# Patient Record
Sex: Female | Born: 1990 | Race: Black or African American | Hispanic: No | Marital: Single | Smoking: Former smoker
Health system: Southern US, Community
[De-identification: ages and names within clinical notes are randomized; demographics above are authoritative.]

## PROBLEM LIST (undated history)

## (undated) HISTORY — PX: WISDOM TOOTH EXTRACTION: SHX21

---

## 2012-12-06 ENCOUNTER — Ambulatory Visit (INDEPENDENT_AMBULATORY_CARE_PROVIDER_SITE_OTHER): Payer: BC Managed Care – PPO | Admitting: Internal Medicine

## 2012-12-06 VITALS — BP 108/74 | HR 74 | Temp 98.0°F | Resp 16 | Ht 68.0 in | Wt 115.6 lb

## 2012-12-06 DIAGNOSIS — J019 Acute sinusitis, unspecified: Secondary | ICD-10-CM

## 2012-12-06 MED ORDER — AMOXICILLIN 875 MG PO TABS: 875.0000 mg | ORAL_TABLET | Freq: Two times a day (BID) | ORAL | Status: DC

## 2012-12-06 MED ORDER — HYDROCODONE-HOMATROPINE 5-1.5 MG/5ML PO SYRP: 5.0000 mL | ORAL_SOLUTION | Freq: Four times a day (QID) | ORAL | Status: DC | PRN

## 2012-12-06 NOTE — Progress Notes (Signed)
  Subjective:    Patient ID: Amy Cohen, female    DOB: 1990/11/05, 22 y.o.   MRN: [id number]  HPI started with a flulike illness several days ago. Over the past 3 days has been unable to sleep due to cough. Cough is only productive in the morning. Also has an early morning sore throat. Has significant sinus pressure. No fever.  Now teaching school    Review of Systems     Objective:   Physical Exam TMs clear Nares boggy with purulence Tender left maxillary area Throat clear No nodes or thyromegaly Chest clear       Assessment & Plan:  Acute sinusitis, unspecified  Meds ordered this encounter  Medications  . HYDROcodone-homatropine (HYCODAN) 5-1.5 MG/5ML syrup    Sig: Take 5 mLs by mouth every 6 (six) hours as needed for cough.    Dispense:  120 mL    Refill:  0  . amoxicillin (AMOXIL) 875 MG tablet    Sig: Take 1 tablet (875 mg total) by mouth 2 (two) times daily.    Dispense:  20 tablet    Refill:  0   Check if not better in one week

## 2014-02-12 ENCOUNTER — Ambulatory Visit (INDEPENDENT_AMBULATORY_CARE_PROVIDER_SITE_OTHER): Payer: BC Managed Care – PPO | Admitting: Emergency Medicine

## 2014-02-12 VITALS — BP 110/68 | HR 83 | Temp 98.5°F | Resp 16 | Ht 67.0 in | Wt 114.2 lb

## 2014-02-12 DIAGNOSIS — J209 Acute bronchitis, unspecified: Secondary | ICD-10-CM

## 2014-02-12 MED ORDER — PROMETHAZINE-CODEINE 6.25-10 MG/5ML PO SYRP: 5.0000 mL | ORAL_SOLUTION | Freq: Four times a day (QID) | ORAL | Status: DC | PRN

## 2014-02-12 MED ORDER — AZITHROMYCIN 250 MG PO TABS: ORAL_TABLET | ORAL | Status: DC

## 2014-02-12 NOTE — Progress Notes (Signed)
[facility] [address], [place] [id number] [phone]  Date:  02/12/2014   Name:  Lauraine RinneLeigh Wantz   DOB:  07/31/1991   MRN:  [id number]  PCP:  No primary provider on file.    Chief Complaint: Cough, Sore Throat and Chest Pain   History of Present Illness:  Lauraine RinneLeigh Ellwood is a 23 y.o. very pleasant female patient who presents with the following:  Ill with a cough for the past week.  Now has anterior pleuritic chest wall pain. No wheezing or shortness of breath.  No fever or chills.. Has scant nasal mucosal drainage.  Cough productive of mucopurulent sputum.  No improvement with over the counter medications or other home remedies. Denies other complaint or health concern today.   There are no active problems to display for this patient.   No past medical history on file.  No past surgical history on file.  History  Substance Use Topics  . Smoking status: Current Every Day Smoker -- 6 years    Types: Cigarettes  . Smokeless tobacco: Not on file  . Alcohol Use: Yes    No family history on file.  No Known Allergies  Medication list has been reviewed and updated.  No current outpatient prescriptions on file prior to visit.   No current facility-administered medications on file prior to visit.    Review of Systems:  As per HPI, otherwise negative.    Physical Examination: Filed Vitals:   02/12/14 1459  BP: 110/68  Pulse: 83  Temp: 98.5 F (36.9 C)  Resp: 16   Filed Vitals:   02/12/14 1459  Height: 5\' 7"  (1.702 m)  Weight: 114 lb 3.2 oz (51.801 kg)   Body mass index is 17.88 kg/(m^2). Ideal Body Weight: Weight in (lb) to have BMI = 25: 159.3  GEN: WDWN, NAD, Non-toxic, A & O x 3 HEENT: Atraumatic, Normocephalic. Neck supple. No masses, No LAD. Ears and Nose: No external deformity. CV: RRR, No M/G/R. No JVD. No thrill. No extra heart sounds. PULM: CTA B, no wheezes, crackles, rhonchi. No retractions. No resp. distress. No accessory  muscle use. ABD: S, NT, ND, +BS. No rebound. No HSM. EXTR: No c/c/e NEURO Normal gait.  PSYCH: Normally interactive. Conversant. Not depressed or anxious appearing.  Calm demeanor.    Assessment and Plan: Bronchitis zpak Phen c cod  Signed,  Phillips OdorJeffery Anderson, MD

## 2014-02-12 NOTE — Patient Instructions (Signed)

## 2014-03-18 ENCOUNTER — Ambulatory Visit (INDEPENDENT_AMBULATORY_CARE_PROVIDER_SITE_OTHER): Payer: BC Managed Care – PPO | Admitting: Family Medicine

## 2014-03-18 VITALS — BP 110/60 | HR 81 | Temp 97.6°F | Resp 16 | Ht 67.5 in | Wt 116.4 lb

## 2014-03-18 DIAGNOSIS — N898 Other specified noninflammatory disorders of vagina: Secondary | ICD-10-CM

## 2014-03-18 DIAGNOSIS — N920 Excessive and frequent menstruation with regular cycle: Secondary | ICD-10-CM

## 2014-03-18 DIAGNOSIS — N921 Excessive and frequent menstruation with irregular cycle: Secondary | ICD-10-CM

## 2014-03-18 LAB — POCT WET PREP WITH KOH
KOH Prep POC: NEGATIVE
Trichomonas, UA: NEGATIVE
WBC Wet Prep HPF POC: NEGATIVE
Yeast Wet Prep HPF POC: NEGATIVE

## 2014-03-18 MED ORDER — AZITHROMYCIN 250 MG PO TABS: ORAL_TABLET | ORAL | Status: DC

## 2014-03-18 NOTE — Progress Notes (Signed)
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 She is sexually active with one partner. She takes Depo-Provera for contraception in her next due the end of this month.  The last 3 weeks she's been having some intermittent spotting without cramps.  Objective: No acute distress External Genitalia: Normal Abdomen: Soft nontender without masses Bimanual exam normal Vaginal exam: Small amount of blood in the cervical os with no cervical motion tenderness, scant blood in the vagina.  Results for orders placed in visit on 03/18/14  POCT WET PREP WITH KOH      Result Value Ref Range   Trichomonas, UA Negative     Clue Cells Wet Prep HPF POC 0-5     Epithelial Wet Prep HPF POC 10-15     Yeast Wet Prep HPF POC neg     Bacteria Wet Prep HPF POC trace     RBC Wet Prep HPF POC 0-1     WBC Wet Prep HPF POC neg     KOH Prep POC Negative     Assessment: No obvious cause for the bleeding. She may have a Chlamydia infection such as alcohol he has been treated with while we await the GEN probe results.  Vaginal discharge - Plan: POCT Wet Prep with KOH, GC/Chlamydia Probe Amp, HIV antibody, azithromycin (ZITHROMAX) 250 MG tablet  Menometrorrhagia - Plan: azithromycin (ZITHROMAX) 250 MG tablet  Signed, Elvina Sidle, MD

## 2014-03-19 LAB — HIV ANTIBODY (ROUTINE TESTING W REFLEX): HIV 1&2 Ab, 4th Generation: NONREACTIVE

## 2014-03-20 LAB — GC/CHLAMYDIA PROBE AMP
CT Probe RNA: NEGATIVE
GC Probe RNA: NEGATIVE

## 2015-07-25 ENCOUNTER — Encounter: Payer: Self-pay | Admitting: General Practice

## 2015-07-25 ENCOUNTER — Ambulatory Visit (INDEPENDENT_AMBULATORY_CARE_PROVIDER_SITE_OTHER): Payer: Self-pay | Admitting: General Practice

## 2015-07-25 DIAGNOSIS — Z3201 Encounter for pregnancy test, result positive: Secondary | ICD-10-CM

## 2015-07-25 DIAGNOSIS — O0932 Supervision of pregnancy with insufficient antenatal care, second trimester: Secondary | ICD-10-CM

## 2015-07-25 LAB — POCT PREGNANCY, URINE: PREG TEST UR: POSITIVE — AB

## 2015-07-25 NOTE — Progress Notes (Signed)
Patient here for pregnancy test today, test positive. Patient reports first positive upt in May. LMP 01/27/15. EDD 11/03/15. 8028w4d today. Would like to start care here. Will schedule u/s today and delay blood work till new OB visit. New OB packets given

## 2015-07-29 ENCOUNTER — Ambulatory Visit: Payer: Self-pay

## 2015-07-31 ENCOUNTER — Ambulatory Visit (INDEPENDENT_AMBULATORY_CARE_PROVIDER_SITE_OTHER): Payer: BC Managed Care – PPO | Admitting: Advanced Practice Midwife

## 2015-07-31 ENCOUNTER — Encounter: Payer: Self-pay | Admitting: Advanced Practice Midwife

## 2015-07-31 VITALS — BP 103/61 | HR 79 | Temp 98.0°F | Ht 69.0 in | Wt 126.7 lb

## 2015-07-31 DIAGNOSIS — Z124 Encounter for screening for malignant neoplasm of cervix: Secondary | ICD-10-CM | POA: Diagnosis not present

## 2015-07-31 DIAGNOSIS — Z113 Encounter for screening for infections with a predominantly sexual mode of transmission: Secondary | ICD-10-CM | POA: Diagnosis not present

## 2015-07-31 DIAGNOSIS — O99312 Alcohol use complicating pregnancy, second trimester: Secondary | ICD-10-CM

## 2015-07-31 DIAGNOSIS — Z3400 Encounter for supervision of normal first pregnancy, unspecified trimester: Secondary | ICD-10-CM | POA: Insufficient documentation

## 2015-07-31 DIAGNOSIS — N898 Other specified noninflammatory disorders of vagina: Secondary | ICD-10-CM

## 2015-07-31 DIAGNOSIS — O26892 Other specified pregnancy related conditions, second trimester: Secondary | ICD-10-CM

## 2015-07-31 DIAGNOSIS — Z3402 Encounter for supervision of normal first pregnancy, second trimester: Secondary | ICD-10-CM

## 2015-07-31 DIAGNOSIS — O99311 Alcohol use complicating pregnancy, first trimester: Secondary | ICD-10-CM | POA: Insufficient documentation

## 2015-07-31 LAB — POCT URINALYSIS DIP (DEVICE)
BILIRUBIN URINE: NEGATIVE
Glucose, UA: NEGATIVE mg/dL
HGB URINE DIPSTICK: NEGATIVE
Ketones, ur: NEGATIVE mg/dL
NITRITE: NEGATIVE
PH: 6.5 (ref 5.0–8.0)
Protein, ur: NEGATIVE mg/dL
Specific Gravity, Urine: 1.02 (ref 1.005–1.030)
Urobilinogen, UA: 0.2 mg/dL (ref 0.0–1.0)

## 2015-07-31 NOTE — Addendum Note (Signed)
Addended by: Gerome ApleyZEYFANG, Emorie Mcfate L on: 07/31/2015 12:03 PM   Modules accepted: Orders

## 2015-07-31 NOTE — Progress Notes (Signed)
Needs pap and labs.  Requests STI testing today.   New OB packet given Advance Directive information given Breastfeeding Tip of the Week reviewed.

## 2015-07-31 NOTE — Progress Notes (Signed)
   Subjective:    Amy Cohen is a G1P0000 4075w3d being seen today for her first obstetrical visit.  Her obstetrical history is significant for none. Patient does intend to breast feed. Pregnancy history fully reviewed.  Patient reports no complaints.  Filed Vitals:   07/31/15 0811 07/31/15 0824  BP: 103/61   Pulse: 79   Temp: 98 F (36.7 C)   Height:  5\' 9"  (1.753 m)  Weight: 126 lb 11.2 oz (57.471 kg)     HISTORY: OB History  Gravida Para Term Preterm AB SAB TAB Ectopic Multiple Living  1 0 0 0 0 0 0 0 0 0     # Outcome Date GA Lbr Len/2nd Weight Sex Delivery Anes PTL Lv  1 Current              History reviewed. No pertinent past medical history. Past Surgical History  Procedure Laterality Date  . Wisdom tooth extraction     Family History  Problem Relation Age of Onset  . Autism Brother   . Cancer Maternal Aunt   . Heart disease Maternal Grandfather   . Hypertension Paternal Grandmother      Exam    Uterus:     Pelvic Exam:    Perineum: No Hemorrhoids   Vulva: normal   Vagina:  normal mucosa, thick yellow discharge with odor   pH:    Cervix: no bleeding following Pap, no cervical motion tenderness, no lesions and nulliparous appearance   Adnexa: normal adnexa and no mass, fullness, tenderness   Bony Pelvis: average  System: Breast:  normal appearance, no masses or tenderness   Skin: normal coloration and turgor, no rashes    Neurologic: oriented, normal, normal mood, gait normal; reflexes normal and symmetric   Extremities: normal strength, tone, and muscle mass   HEENT sclera clear, anicteric   Mouth/Teeth mucous membranes moist, pharynx normal without lesions and dental hygiene good   Neck supple and no masses   Cardiovascular: regular rate and rhythm   Respiratory:  appears well, vitals normal, no respiratory distress, acyanotic, normal RR, ear and throat exam is normal, neck free of mass or lymphadenopathy, chest clear, no wheezing, crepitations,  rhonchi, normal symmetric air entry   Abdomen: soft, non-tender; bowel sounds normal; no masses,  no organomegaly   Urinary: urethral meatus normal      Assessment:    Pregnancy: G1P0000 Patient Active Problem List   Diagnosis Date Noted  . Supervision of normal first pregnancy 07/31/2015  . 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 Prenatal vitamins. 1 hour glucose screen Discussed recommended Tdap and Flu vaccines in pregnancy. Pt undecided at today's visit. Problem list reviewed and updated. Genetic Screening discussed : late to care.  Ultrasound discussed; fetal survey: ordered.  Follow up in 4 weeks.   LEFTWICH-KIRBY, Jeoffrey Eleazer 07/31/2015

## 2015-08-01 ENCOUNTER — Other Ambulatory Visit: Payer: Self-pay | Admitting: Advanced Practice Midwife

## 2015-08-01 ENCOUNTER — Ambulatory Visit (HOSPITAL_COMMUNITY)
Admission: RE | Admit: 2015-08-01 | Discharge: 2015-08-01 | Disposition: A | Discharge status: Home / Self Care | Payer: BC Managed Care – PPO | Source: Ambulatory Visit | Attending: Obstetrics & Gynecology | Admitting: Obstetrics & Gynecology

## 2015-08-01 DIAGNOSIS — O0932 Supervision of pregnancy with insufficient antenatal care, second trimester: Secondary | ICD-10-CM | POA: Diagnosis not present

## 2015-08-01 DIAGNOSIS — Z3201 Encounter for pregnancy test, result positive: Secondary | ICD-10-CM

## 2015-08-01 LAB — PRESCRIPTION MONITORING PROFILE (19 PANEL)
AMPHETAMINE/METH: NEGATIVE ng/mL
BARBITURATE SCREEN, URINE: NEGATIVE ng/mL
BENZODIAZEPINE SCREEN, URINE: NEGATIVE ng/mL
Buprenorphine, Urine: NEGATIVE ng/mL
COCAINE METABOLITES: NEGATIVE ng/mL
CREATININE, URINE: 100.89 mg/dL (ref >20.0)
Cannabinoid Scrn, Ur: NEGATIVE ng/mL
Carisoprodol, Urine: NEGATIVE ng/mL
ECSTASY: NEGATIVE ng/mL
Fentanyl, Ur: NEGATIVE ng/mL
METHAQUALONE SCREEN (URINE): NEGATIVE ng/mL
Meperidine, Ur: NEGATIVE ng/mL
Methadone Screen, Urine: NEGATIVE ng/mL
Nitrites, Initial: NEGATIVE ug/mL
Opiate Screen, Urine: NEGATIVE ng/mL
Oxycodone Screen, Ur: NEGATIVE ng/mL
PH URINE, INITIAL: 6.3 pH (ref 4.5–8.9)
PHENCYCLIDINE, UR: NEGATIVE ng/mL
Propoxyphene: NEGATIVE ng/mL
TRAMADOL UR: NEGATIVE ng/mL
Tapentadol, urine: NEGATIVE ng/mL
ZOLPIDEM, URINE: NEGATIVE ng/mL

## 2015-08-01 LAB — PRENATAL PROFILE (SOLSTAS)
ANTIBODY SCREEN: NEGATIVE
BASOS ABS: 0 10*3/uL (ref 0.0–0.1)
Basophils Relative: 0 % (ref 0–1)
EOS ABS: 0 10*3/uL (ref 0.0–0.7)
EOS PCT: 0 % (ref 0–5)
HCT: 35.7 % — ABNORMAL LOW (ref 36.0–46.0)
HIV 1&2 Ab, 4th Generation: NONREACTIVE
Hemoglobin: 11.3 g/dL — ABNORMAL LOW (ref 12.0–15.0)
Hepatitis B Surface Ag: NEGATIVE
LYMPHS ABS: 1.5 10*3/uL (ref 0.7–4.0)
Lymphocytes Relative: 13 % (ref 12–46)
MCH: 24.4 pg — ABNORMAL LOW (ref 26.0–34.0)
MCHC: 31.7 g/dL (ref 30.0–36.0)
MCV: 77.1 fL — AB (ref 78.0–100.0)
MPV: 10.4 fL (ref 8.6–12.4)
Monocytes Absolute: 0.6 10*3/uL (ref 0.1–1.0)
Monocytes Relative: 5 % (ref 3–12)
Neutro Abs: 9.5 10*3/uL — ABNORMAL HIGH (ref 1.7–7.7)
Neutrophils Relative %: 82 % — ABNORMAL HIGH (ref 43–77)
PLATELETS: 245 10*3/uL (ref 150–400)
RBC: 4.63 MIL/uL (ref 3.87–5.11)
RDW: 14.6 % (ref 11.5–15.5)
RH TYPE: POSITIVE
Rubella: 15.6 Index — ABNORMAL HIGH (ref <0.90)
WBC: 11.6 10*3/uL — AB (ref 4.0–10.5)

## 2015-08-01 LAB — GLUCOSE TOLERANCE, 1 HOUR (50G) W/O FASTING: GLUCOSE 1 HOUR GTT: 93 mg/dL (ref 70–140)

## 2015-08-01 LAB — WET PREP, GENITAL
TRICH WET PREP: NONE SEEN
YEAST WET PREP: NONE SEEN

## 2015-08-01 LAB — CULTURE, OB URINE
COLONY COUNT: NO GROWTH
ORGANISM ID, BACTERIA: NO GROWTH

## 2015-08-01 LAB — HEPATITIS C ANTIBODY: HCV Ab: NEGATIVE

## 2015-08-01 LAB — GC/CHLAMYDIA PROBE AMP (~~LOC~~) NOT AT ARMC
Chlamydia: NEGATIVE
Neisseria Gonorrhea: NEGATIVE

## 2015-08-01 MED ORDER — METRONIDAZOLE 500 MG PO TABS: 500.0000 mg | ORAL_TABLET | Freq: Two times a day (BID) | ORAL | Status: DC

## 2015-08-05 ENCOUNTER — Encounter: Payer: Self-pay | Admitting: *Deleted

## 2015-08-06 ENCOUNTER — Encounter: Payer: Self-pay | Admitting: Advanced Practice Midwife

## 2015-08-08 ENCOUNTER — Encounter: Payer: Self-pay | Admitting: Advanced Practice Midwife

## 2015-08-08 LAB — CYTOLOGY - PAP

## 2015-08-27 ENCOUNTER — Ambulatory Visit (INDEPENDENT_AMBULATORY_CARE_PROVIDER_SITE_OTHER): Payer: BC Managed Care – PPO | Admitting: Advanced Practice Midwife

## 2015-08-27 ENCOUNTER — Encounter: Payer: Self-pay | Admitting: Advanced Practice Midwife

## 2015-08-27 VITALS — BP 109/65 | HR 78 | Temp 98.1°F | Wt 127.4 lb

## 2015-08-27 DIAGNOSIS — Z3402 Encounter for supervision of normal first pregnancy, second trimester: Secondary | ICD-10-CM

## 2015-08-27 LAB — POCT URINALYSIS DIP (DEVICE)
BILIRUBIN URINE: NEGATIVE
Glucose, UA: NEGATIVE mg/dL
HGB URINE DIPSTICK: NEGATIVE
Ketones, ur: 40 mg/dL — AB
NITRITE: NEGATIVE
PH: 6 (ref 5.0–8.0)
PROTEIN: NEGATIVE mg/dL
Specific Gravity, Urine: 1.02 (ref 1.005–1.030)
UROBILINOGEN UA: 0.2 mg/dL (ref 0.0–1.0)

## 2015-08-27 NOTE — Patient Instructions (Signed)

## 2015-08-27 NOTE — Progress Notes (Signed)
Pain- "cramping"

## 2015-08-27 NOTE — Progress Notes (Signed)
Subjective:  Amy Cohen is a 24 y.o. G1P0000 at 3644w0d being seen today for ongoing prenatal care.  Patient reports no contractions, no cramping, no leaking and round ligament pain.  Contractions: Not present.  Vag. Bleeding: None. Movement: Present. Denies leaking of fluid.   The following portions of the patient's history were reviewed and updated as appropriate: allergies, current medications, past family history, past medical history, past social history, past surgical history and problem list. Problem list updated.  Objective:   Filed Vitals:   08/27/15 1608  BP: 109/65  Pulse: 78  Temp: 98.1 F (36.7 C)  Weight: 127 lb 6.4 oz (57.788 kg)    Fetal Status: Fetal Heart Rate (bpm): 149   Movement: Present     General:  Alert, oriented and cooperative. Patient is in no acute distress.  Skin: Skin is warm and dry. No rash noted.   Cardiovascular: Normal heart rate noted  Respiratory: Normal respiratory effort, no problems with respiration noted  Abdomen: Soft, gravid, appropriate for gestational age. Pain/Pressure: Present     Pelvic: Vag. Bleeding: None     Cervical exam deferred        Extremities: Normal range of motion.  Edema: None  Mental Status: Normal mood and affect. Normal behavior. Normal judgment and thought content.   Urinalysis: Urine Protein: Negative Urine Glucose: Negative  Assessment and Plan:  Pregnancy: G1P0000 at 2444w0d  There are no diagnoses linked to this encounter. Preterm labor symptoms and general obstetric precautions including but not limited to vaginal bleeding, contractions, leaking of fluid and fetal movement were reviewed in detail with the patient. Please refer to After Visit Summary for other counseling recommendations.  Return in about 2 weeks (around 09/10/2015) for [facility] Risk [facility].  Will re-do glucola next week. Last one done too early at 23 weeks (dates unkown)  Aviva SignsMarie L Rayya Yagi, CNM

## 2015-09-04 ENCOUNTER — Other Ambulatory Visit: Payer: BC Managed Care – PPO

## 2015-09-04 DIAGNOSIS — Z3403 Encounter for supervision of normal first pregnancy, third trimester: Secondary | ICD-10-CM

## 2015-09-04 LAB — CBC
HCT: 35.8 % — ABNORMAL LOW (ref 36.0–46.0)
HEMOGLOBIN: 11.2 g/dL — AB (ref 12.0–15.0)
MCH: 23.7 pg — AB (ref 26.0–34.0)
MCHC: 31.3 g/dL (ref 30.0–36.0)
MCV: 75.8 fL — ABNORMAL LOW (ref 78.0–100.0)
MPV: 10.5 fL (ref 8.6–12.4)
Platelets: 203 10*3/uL (ref 150–400)
RBC: 4.72 MIL/uL (ref 3.87–5.11)
RDW: 14.6 % (ref 11.5–15.5)
WBC: 11.1 10*3/uL — AB (ref 4.0–10.5)

## 2015-09-05 LAB — GLUCOSE TOLERANCE, 1 HOUR (50G) W/O FASTING: Glucose, 1 Hour GTT: 78 mg/dL (ref 70–140)

## 2015-09-05 LAB — HIV ANTIBODY (ROUTINE TESTING W REFLEX): HIV: NONREACTIVE

## 2015-09-05 LAB — RPR

## 2015-09-10 ENCOUNTER — Ambulatory Visit (INDEPENDENT_AMBULATORY_CARE_PROVIDER_SITE_OTHER): Payer: BC Managed Care – PPO | Admitting: Certified Nurse Midwife

## 2015-09-10 VITALS — BP 120/54 | HR 70 | Wt 132.5 lb

## 2015-09-10 DIAGNOSIS — Z3493 Encounter for supervision of normal pregnancy, unspecified, third trimester: Secondary | ICD-10-CM

## 2015-09-10 DIAGNOSIS — Z3483 Encounter for supervision of other normal pregnancy, third trimester: Secondary | ICD-10-CM

## 2015-09-10 LAB — POCT URINALYSIS DIP (DEVICE)
Bilirubin Urine: NEGATIVE
Glucose, UA: NEGATIVE mg/dL
Hgb urine dipstick: NEGATIVE
Nitrite: NEGATIVE
Protein, ur: 30 mg/dL — AB
Specific Gravity, Urine: 1.025 (ref 1.005–1.030)
Urobilinogen, UA: 0.2 mg/dL (ref 0.0–1.0)
pH: 7 (ref 5.0–8.0)

## 2015-09-10 MED ORDER — TETANUS-DIPHTH-ACELL PERTUSSIS 5-2.5-18.5 LF-MCG/0.5 IM SUSP
0.5000 mL | Freq: Once | INTRAMUSCULAR | Status: AC
  Administered 2015-09-10: 0.5 mL via INTRAMUSCULAR

## 2015-09-10 NOTE — Progress Notes (Signed)
Pt states she got a flu shot yesterday @ [facility].  Breastfeeding tip of the week reviewed.

## 2015-09-10 NOTE — Patient Instructions (Signed)

## 2015-09-10 NOTE — Progress Notes (Signed)
Subjective:  Amy Cohen is a 24 y.o. G1P0000 at 1824w0d being seen today for ongoing prenatal care.  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  Patient reports no complaints.  Contractions: Irregular. Vag. Bleeding: None.  Movement: Present. Denies leaking of fluid.   The following portions of the patient's history were reviewed and updated as appropriate: allergies, current medications, past family history, past medical history, past social history, past surgical history and problem list. Problem list updated.  Objective:   Filed Vitals:   09/10/15 1605  BP: 120/54  Pulse: 70  Weight: 132 lb 8 oz (60.102 kg)    Fetal Status: Fetal Heart Rate (bpm): 134   Movement: Present     General:  Alert, oriented and cooperative. Patient is in no acute distress.  Skin: Skin is warm and dry. No rash noted.   Cardiovascular: Normal heart rate noted  Respiratory: Normal respiratory effort, no problems with respiration noted  Abdomen: Soft, gravid, appropriate for gestational age. Pain/Pressure: Present     Pelvic: Vag. Bleeding: None     Cervical exam deferred        Extremities: Normal range of motion.     Mental Status: Normal mood and affect. Normal behavior. Normal judgment and thought content.   Urinalysis: Urine Protein: 1+ Urine Glucose: Negative  Assessment and Plan:  Pregnancy: G1P0000 at 7524w0d  There are no diagnoses linked to this encounter. Preterm labor symptoms and general obstetric precautions including but not limited to vaginal bleeding, contractions, leaking of fluid and fetal movement were reviewed in detail with the patient. Please refer to After Visit Summary for other counseling recommendations.  Return in about 2 weeks (around 09/24/2015).  TDAP today Rhea PinkLori A Clemmons, CNM

## 2015-09-24 ENCOUNTER — Encounter: Payer: BC Managed Care – PPO | Admitting: Advanced Practice Midwife

## 2015-10-08 ENCOUNTER — Ambulatory Visit (INDEPENDENT_AMBULATORY_CARE_PROVIDER_SITE_OTHER): Payer: BC Managed Care – PPO | Admitting: Certified Nurse Midwife

## 2015-10-08 VITALS — BP 117/69 | HR 79 | Wt 135.3 lb

## 2015-10-08 DIAGNOSIS — O26893 Other specified pregnancy related conditions, third trimester: Secondary | ICD-10-CM

## 2015-10-08 DIAGNOSIS — N898 Other specified noninflammatory disorders of vagina: Secondary | ICD-10-CM

## 2015-10-08 LAB — POCT URINALYSIS DIP (DEVICE)
Bilirubin Urine: NEGATIVE
GLUCOSE, UA: NEGATIVE mg/dL
Ketones, ur: NEGATIVE mg/dL
NITRITE: NEGATIVE
PROTEIN: NEGATIVE mg/dL
Specific Gravity, Urine: 1.025 (ref 1.005–1.030)
UROBILINOGEN UA: 4 mg/dL — AB (ref 0.0–1.0)
pH: 6 (ref 5.0–8.0)

## 2015-10-08 NOTE — Progress Notes (Signed)
Subjective:  Amy Cohen is a 24 y.o. G1P0000 at 7862w0d being seen today for ongoing prenatal care.  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  Patient reports vaginal discharge.  Contractions: Irregular. Vag. Bleeding: None.  Movement: Present. Denies leaking of fluid.   The following portions of the patient's history were reviewed and updated as appropriate: allergies, current medications, past family history, past medical history, past social history, past surgical history and problem list. Problem list updated.  Objective:   Filed Vitals:   10/08/15 1612  BP: 117/69  Pulse: 79  Weight: 135 lb 4.8 oz (61.372 kg)    Fetal Status:     Movement: Present     General:  Alert, oriented and cooperative. Patient is in no acute distress.  Skin: Skin is warm and dry. No rash noted.   Cardiovascular: Normal heart rate noted  Respiratory: Normal respiratory effort, no problems with respiration noted  Abdomen: Soft, gravid, appropriate for gestational age. Pain/Pressure: Absent     Pelvic: Vag. Bleeding: None Vag D/C Character: Yellow   Cervical exam deferred        Extremities: Normal range of motion.  Edema: None  Mental Status: Normal mood and affect. Normal behavior. Normal judgment and thought content.   Urinalysis:      Assessment and Plan:  Pregnancy: G1P0000 at 7862w0d  There are no diagnoses linked to this encounter. Preterm labor symptoms and general obstetric precautions including but not limited to vaginal bleeding, contractions, leaking of fluid and fetal movement were reviewed in detail with the patient. Please refer to After Visit Summary for other counseling recommendations.  Return in about 2 weeks (around 10/22/2015).   Rhea PinkLori A Clemmons, CNM

## 2015-10-08 NOTE — Patient Instructions (Signed)

## 2015-10-08 NOTE — Progress Notes (Signed)
Pt reports some discharge and just feels uncomfortable, some itching at times.

## 2015-10-09 ENCOUNTER — Telehealth: Payer: Self-pay | Admitting: General Practice

## 2015-10-09 DIAGNOSIS — N760 Acute vaginitis: Principal | ICD-10-CM

## 2015-10-09 DIAGNOSIS — B9689 Other specified bacterial agents as the cause of diseases classified elsewhere: Secondary | ICD-10-CM

## 2015-10-09 LAB — WET PREP, GENITAL
Trich, Wet Prep: NONE SEEN
Yeast Wet Prep HPF POC: NONE SEEN

## 2015-10-09 MED ORDER — METRONIDAZOLE 500 MG PO TABS: 500.0000 mg | ORAL_TABLET | Freq: Two times a day (BID) | ORAL | Status: DC

## 2015-10-09 NOTE — Telephone Encounter (Signed)
Per Amy FiscalLori, patient has BV and needs flagyl. Med ordered. Called patient, no answer- unable to leave message due to full voicemail.

## 2015-10-10 NOTE — Telephone Encounter (Signed)
Called pt and advised of +BV and treatment required. Rx has been sent to her pharmacy.  Pt voiced understanding.

## 2015-10-13 NOTE — L&D Delivery Note (Signed)
Patient is 25 y.o. G1P0000 [redacted]w[redacted]d admitted for IOL secondary to post dates. Ripened with cytotec and proceeded to active labor with rapid progression after that. GBS positive, inadequately treated.  Delivery Note At 9:41 AM a viable female was delivered via Vaginal, Spontaneous Delivery (Presentation: ; Occiput Anterior).  APGAR: 7, 9; weight 7 lb 1 oz .   Placenta status: Intact, Spontaneous.  Cord: 3 vessels with the following complications: None.  Cord pH: n/a  Upper limit of normal bleeding, uterus swept x1 and no significant clot and no retained products encountered.   Anesthesia: Epidural  Episiotomy: None Lacerations: 1st degree;Periurethral Suture Repair: none needed Est. Blood Loss (mL): 500  Mom to postpartum.  Baby to Couplet care / Skin to Skin.  Beaulah Dinning, MD 12/04/2015, 10:18 AM  OB FELLOW DELIVERY ATTESTATION  I was gloved and present for the delivery in its entirety, and I agree with the above resident's note.    Silvano Bilis, MD 1:01 PM

## 2015-10-22 ENCOUNTER — Ambulatory Visit (INDEPENDENT_AMBULATORY_CARE_PROVIDER_SITE_OTHER): Payer: BC Managed Care – PPO | Admitting: Advanced Practice Midwife

## 2015-10-22 ENCOUNTER — Encounter: Payer: Self-pay | Admitting: Advanced Practice Midwife

## 2015-10-22 ENCOUNTER — Other Ambulatory Visit (HOSPITAL_COMMUNITY): Admission: RE | Admit: 2015-10-22 | Payer: BC Managed Care – PPO | Source: Ambulatory Visit

## 2015-10-22 DIAGNOSIS — B373 Candidiasis of vulva and vagina: Secondary | ICD-10-CM

## 2015-10-22 DIAGNOSIS — Z113 Encounter for screening for infections with a predominantly sexual mode of transmission: Secondary | ICD-10-CM

## 2015-10-22 DIAGNOSIS — Z3493 Encounter for supervision of normal pregnancy, unspecified, third trimester: Secondary | ICD-10-CM

## 2015-10-22 DIAGNOSIS — Z3483 Encounter for supervision of other normal pregnancy, third trimester: Secondary | ICD-10-CM

## 2015-10-22 DIAGNOSIS — B3731 Acute candidiasis of vulva and vagina: Secondary | ICD-10-CM

## 2015-10-22 DIAGNOSIS — O98813 Other maternal infectious and parasitic diseases complicating pregnancy, third trimester: Secondary | ICD-10-CM

## 2015-10-22 LAB — OB RESULTS CONSOLE GBS: STREP GROUP B AG: POSITIVE

## 2015-10-22 LAB — POCT URINALYSIS DIP (DEVICE)
Bilirubin Urine: NEGATIVE
GLUCOSE, UA: NEGATIVE mg/dL
Ketones, ur: NEGATIVE mg/dL
NITRITE: NEGATIVE
Protein, ur: NEGATIVE mg/dL
Specific Gravity, Urine: 1.015 (ref 1.005–1.030)
UROBILINOGEN UA: 0.2 mg/dL (ref 0.0–1.0)
pH: 5.5 (ref 5.0–8.0)

## 2015-10-22 LAB — OB RESULTS CONSOLE GC/CHLAMYDIA: GC PROBE AMP, GENITAL: NEGATIVE

## 2015-10-22 MED ORDER — TERCONAZOLE 0.4 % VA CREA: 1.0000 | TOPICAL_CREAM | Freq: Every day | VAGINAL | Status: DC

## 2015-10-22 NOTE — Progress Notes (Signed)
Finished flagyl course, still complaining of itchy, lumpy yellow-green discharge Complaining of period-like lower abdominal cramping Breastfeeding tip of the week reviewed Cultures today

## 2015-10-22 NOTE — Patient Instructions (Signed)

## 2015-10-22 NOTE — Progress Notes (Signed)
Subjective:  Amy Cohen is a 25 y.o. G1P0000 at 1828w0d being seen today for ongoing prenatal care.  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  Patient reports no complaints.  Contractions: Irregular. Vag. Bleeding: None.  Movement: Present. Denies leaking of fluid.   The following portions of the patient's history were reviewed and updated as appropriate: allergies, current medications, past family history, past medical history, past social history, past surgical history and problem list. Problem list updated.  Objective:   Filed Vitals:   10/22/15 1611  BP: 119/75  Pulse: 80  Temp: 98.3 F (36.8 C)  Weight: 62.506 kg (137 lb 12.8 oz)    Fetal Status: Fetal Heart Rate (bpm): 135   Movement: Present     General:  Alert, oriented and cooperative. Patient is in no acute distress.  Skin: Skin is warm and dry. No rash noted.   Cardiovascular: Normal heart rate noted  Respiratory: Normal respiratory effort, no problems with respiration noted  Abdomen: Soft, gravid, appropriate for gestational age. Pain/Pressure: Present     Pelvic: Vag. Bleeding: None Vag D/C Character: Other (Comment)   Cervical exam performed        Extremities: Normal range of motion.  Edema: None  Mental Status: Normal mood and affect. Normal behavior. Normal judgment and thought content.   Urinalysis:      Assessment and Plan:  Pregnancy: G1P0000 at 6628w0d  1. Supervision of normal pregnancy in third trimester      Cultures and GBS rectovaginal done today       Looks like US followup never done after 23 wk incomplete US.  Message sent to RN s to schedule - POCT urinalysis dip (device) - GC/Chlamydia probe amp ([place] - Culture, beta strep (group b only)  2. Vaginal yeast infection     + per exam     Rx Terazol 7 - GC/Chlamydia  probe amp ([place]  Preterm labor symptoms and general obstetric precautions including but not limited to vaginal bleeding, contractions, leaking of fluid and fetal movement were reviewed in detail with the patient. Please refer to After Visit Summary for other counseling recommendations.  Return in about 2 weeks (around 11/05/2015) for Low Risk Clinic.   Aviva SignsMarie L Shawana Knoch, CNM

## 2015-10-23 LAB — GC/CHLAMYDIA PROBE AMP (~~LOC~~) NOT AT ARMC
Chlamydia: NEGATIVE
Neisseria Gonorrhea: NEGATIVE

## 2015-10-23 LAB — CULTURE, BETA STREP (GROUP B ONLY)

## 2015-10-24 ENCOUNTER — Telehealth: Payer: Self-pay | Admitting: *Deleted

## 2015-10-24 ENCOUNTER — Encounter: Payer: Self-pay | Admitting: *Deleted

## 2015-10-24 DIAGNOSIS — Z3403 Encounter for supervision of normal first pregnancy, third trimester: Secondary | ICD-10-CM

## 2015-10-24 NOTE — Telephone Encounter (Signed)
Per Hilda LiasMarie patient needs a  followup ultrasound. Ultrasound scheduled for 10/30/2015.

## 2015-10-25 ENCOUNTER — Encounter: Payer: Self-pay | Admitting: Advanced Practice Midwife

## 2015-10-25 DIAGNOSIS — B951 Streptococcus, group B, as the cause of diseases classified elsewhere: Secondary | ICD-10-CM | POA: Insufficient documentation

## 2015-10-25 DIAGNOSIS — O98819 Other maternal infectious and parasitic diseases complicating pregnancy, unspecified trimester: Secondary | ICD-10-CM

## 2015-10-29 ENCOUNTER — Ambulatory Visit (INDEPENDENT_AMBULATORY_CARE_PROVIDER_SITE_OTHER): Payer: BC Managed Care – PPO | Admitting: Advanced Practice Midwife

## 2015-10-29 ENCOUNTER — Encounter: Payer: Self-pay | Admitting: Advanced Practice Midwife

## 2015-10-29 VITALS — BP 118/78 | HR 91 | Wt 139.7 lb

## 2015-10-29 DIAGNOSIS — Z3403 Encounter for supervision of normal first pregnancy, third trimester: Secondary | ICD-10-CM | POA: Diagnosis not present

## 2015-10-29 DIAGNOSIS — Z789 Other specified health status: Secondary | ICD-10-CM

## 2015-10-29 LAB — POCT URINALYSIS DIP (DEVICE)
Bilirubin Urine: NEGATIVE
GLUCOSE, UA: NEGATIVE mg/dL
Hgb urine dipstick: NEGATIVE
KETONES UR: NEGATIVE mg/dL
Nitrite: NEGATIVE
PROTEIN: NEGATIVE mg/dL
SPECIFIC GRAVITY, URINE: 1.02 (ref 1.005–1.030)
UROBILINOGEN UA: 0.2 mg/dL (ref 0.0–1.0)
pH: 7 (ref 5.0–8.0)

## 2015-10-29 MED ORDER — BREAST PUMP MISC: Status: DC

## 2015-10-29 NOTE — Patient Instructions (Addendum)
Braxton Hicks Contractions Contractions of the uterus can occur throughout pregnancy. Contractions are not always a sign that you are in labor.  WHAT ARE BRAXTON HICKS CONTRACTIONS?  Contractions that occur before labor are called Braxton Hicks contractions, or false labor. Toward the end of pregnancy (32-34 weeks), these contractions can develop more often and may become more forceful. This is not true labor because these contractions do not result in opening (dilatation) and thinning of the cervix. They are sometimes difficult to tell apart from true labor because these contractions can be forceful and people have different pain tolerances. You should not feel embarrassed if you go to the hospital with false labor. Sometimes, the only way to tell if you are in true labor is for your health care provider to look for changes in the cervix. If there are no prenatal problems or other health problems associated with the pregnancy, it is completely safe to be sent home with false labor and await the onset of true labor. HOW CAN YOU TELL THE DIFFERENCE BETWEEN TRUE AND FALSE LABOR? False Labor  The contractions of false labor are usually shorter and not as hard as those of true labor.   The contractions are usually irregular.   The contractions are often felt in the front of the lower abdomen and in the groin.   The contractions may go away when you walk around or change positions while lying down.   The contractions get weaker and are shorter lasting as time goes on.   The contractions do not usually become progressively stronger, regular, and closer together as with true labor.  True Labor 1. Contractions in true labor last 30-70 seconds, become very regular, usually become more intense, and increase in frequency.  2. The contractions do not go away with walking.  3. The discomfort is usually felt in the top of the uterus and spreads to the lower abdomen and low back.  4. True labor can  be determined by your health care provider with an exam. This will show that the cervix is dilating and getting thinner.  WHAT TO REMEMBER  Keep up with your usual exercises and follow other instructions given by your health care provider.   Take medicines as directed by your health care provider.   Keep your regular prenatal appointments.   Eat and drink lightly if you think you are going into labor.   If Braxton Hicks contractions are making you uncomfortable:   Change your position from lying down or resting to walking, or from walking to resting.   Sit and rest in a tub of warm water.   Drink 2-3 glasses of water. Dehydration may cause these contractions.   Do slow and deep breathing several times an hour.  WHEN SHOULD I SEEK IMMEDIATE MEDICAL CARE? Seek immediate medical care if:  Your contractions become stronger, more regular, and closer together.   You have fluid leaking or gushing from your vagina.   You have a fever.   You pass blood-tinged mucus.   You have vaginal bleeding.   You have continuous abdominal pain.   You have low back pain that you never had before.   You feel your baby's head pushing down and causing pelvic pressure.   Your baby is not moving as much as it used to.    This information is not intended to replace advice given to you by your health care provider. Make sure you discuss any questions you have with your health care  provider.   Document Released: 09/28/2005 Document Revised: 10/03/2013 Document Reviewed: 07/10/2013 Elsevier Interactive Patient Education 2016 [employer]  Pain Relief During Labor and Delivery Everyone experiences pain differently, but labor causes severe pain for many women. The amount of pain you experience during labor and delivery depends on your pain tolerance, contraction strength, and your baby's size and position. There are many ways to prepare for and deal with the pain, including:    Taking prenatal classes to learn about labor and delivery. The more informed you are, the less anxious and afraid you may be. This can help lessen the pain.  Taking pain-relieving medicine during labor and delivery.  Learning breathing and relaxation techniques.  Taking a shower or bath.  Getting massaged.  Changing positions.  Placing an ice pack on your back. Discuss your pain control options with your health care provider during your prenatal visits.  WHAT ARE THE TWO TYPES OF PAIN-RELIEVING MEDICINES? 5. Analgesics. These are medicines that decrease pain without total loss of feeling or muscle movement. 6. Anesthetics. These are medicines that block all feeling, including pain. There can be minor side effects of both types, such as nausea, trouble concentrating, becoming sleepy, and lowering the heart rate of the baby. However, health care providers are careful to give doses that will not seriously affect the baby.  WHAT ARE THE SPECIFIC TYPES OF ANALGESICS AND ANESTHETICS? Systemic Analgesic Systemic pain medicines affect your whole body rather than focusing pain relief on the area of your body experiencing pain. This type of medicine is given either through an IV tube in your vein or by a shot (injection) into your muscle. This medicine will lessen your pain but will not stop it completely. It may also make you sleepy, but it will not make you lose consciousness.  Local Anesthetic Local anesthetic isused tonumb a small area of your body. The medicine is injected into the area of nerves that carry feeling to the vagina, vulva, or the area between the vagina and anus (perineum).  General Anesthetic This type of medicine causes you to lose consciousness so you do not feel pain. It is usually used only in emergency situations during labor. It is given through an IV tube or face mask. Paracervical Block A paracervical block is a form of local anesthesia given during labor. Numbing  medicine is injected into the right and left sides of the cervix and vagina. It helps to lessen the pain caused by contractions and stretching of the cervix. It may have to be given more than once.  Pudendal Block A pudendal block is another form of local anesthesia. It is used to relieve the pain associated with pushing or stretching of the perineum at the time of delivery. An injection is given deep through the vaginal wall into the pudendal nerve in the pelvis, numbing the perineum.  Epidural Anesthetic An epidural is an injection of numbing medicine given in the lower back and into the epidural space near your spinal cord. The epidural numbs the lower half of your body. You may be able to move your legs but will not be allowed to walk. Epidurals can be used for labor, delivery, or cesarean deliveries.  To prevent the medicine from wearing off, a small tube (catheter) may be threaded into the epidural space and taped in place to prevent it from slipping out. Medicine can then be given continuously in small doses through the tube until you deliver. Spinal Block A spinal block is similar to  an epidural, but the medicine is injected into the spinal fluid, not the epidural space. A spinal block is only given once. It starts to relieve pain quickly but lasts only 1-2 hours. Spinal blocks can also be used for cesarean deliveries.  Combined Spinal-Epidural Block Combined spinal-epidural blocks combine the benefits of both the spinal and epidural blocks. The spinal part acts quickly to relieve pain and the epidural provides continuous pain relief. Hydrotherapy Immersion in warm water during labor may provide comfort and relaxation. It may also help to lessen pain, the use of anesthesia, and the length of labor. However, immersion in water during the delivery (water birth) may have some risk involved and studies to determine safety and risks are ongoing. If you are a healthy woman who is expecting an  uncomplicated birth, talk with your health care provider to see if water birth is an option for you.    This information is not intended to replace advice given to you by your health care provider. Make sure you discuss any questions you have with your health care provider.   Document Released: 01/14/2009 Document Revised: 10/03/2013 Document Reviewed: 02/16/2013 Elsevier Interactive Patient Education [employer]

## 2015-10-30 ENCOUNTER — Other Ambulatory Visit: Payer: Self-pay | Admitting: Advanced Practice Midwife

## 2015-10-30 ENCOUNTER — Ambulatory Visit (HOSPITAL_COMMUNITY)
Admission: RE | Admit: 2015-10-30 | Discharge: 2015-10-30 | Disposition: A | Discharge status: Home / Self Care | Payer: BC Managed Care – PPO | Source: Ambulatory Visit | Attending: Advanced Practice Midwife | Admitting: Advanced Practice Midwife

## 2015-10-30 DIAGNOSIS — Z3403 Encounter for supervision of normal first pregnancy, third trimester: Secondary | ICD-10-CM | POA: Insufficient documentation

## 2015-10-30 NOTE — Progress Notes (Signed)
Subjective:  Amy Cohen is a 25 y.o. G1P0000 at [redacted]w[redacted]d being seen today for ongoing prenatal care.  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  Patient reports no complaints.  Contractions: Irregular. Vag. Bleeding: None.  Movement: Present. Denies leaking of fluid.   The following portions of the patient's history were reviewed and updated as appropriate: allergies, current medications, past family history, past medical history, past social history, past surgical history and problem list. Problem list updated.  Objective:   Filed Vitals:   10/29/15 1557  BP: 118/78  Pulse: 91  Weight: 139 lb 11.2 oz (63.368 kg)    Fetal Status: Fetal Heart Rate (bpm): 150   Movement: Present     General:  Alert, oriented and cooperative. Patient is in no acute distress.  Skin: Skin is warm and dry. No rash noted.   Cardiovascular: Normal heart rate noted  Respiratory: Normal respiratory effort, no problems with respiration noted  Abdomen: Soft, gravid, appropriate for gestational age. Pain/Pressure: Present     Pelvic: Vag. Bleeding: None     Cervical exam deferred        Extremities: Normal range of motion.  Edema: None  Mental Status: Normal mood and affect. Normal behavior. Normal judgment and thought content.   Urinalysis: Urine Protein: Negative Urine Glucose: Negative  Assessment and Plan:  Pregnancy: G1P0000 at [redacted]w[redacted]d  1. Encounter for supervision of normal first pregnancy in third trimester   2. Exclusively breastfeed infant  3. GBS pos.  - PCN in labor  - Misc. Devices (BREAST PUMP) MISC; Dispense one breast pump for patient  Dispense: 1 each; Refill: 0  Term labor symptoms and general obstetric precautions including but not limited to vaginal bleeding, contractions,  leaking of fluid and fetal movement were reviewed in detail with the patient. Please refer to After Visit Summary for other counseling recommendations.  Return in about 1 week (around 11/05/2015).   Dorathy Kinsman, CNM

## 2015-11-05 ENCOUNTER — Ambulatory Visit (INDEPENDENT_AMBULATORY_CARE_PROVIDER_SITE_OTHER): Payer: BC Managed Care – PPO | Admitting: Family

## 2015-11-05 VITALS — BP 130/69 | HR 84 | Temp 98.2°F | Wt 140.5 lb

## 2015-11-05 DIAGNOSIS — Z3403 Encounter for supervision of normal first pregnancy, third trimester: Secondary | ICD-10-CM

## 2015-11-05 LAB — POCT URINALYSIS DIP (DEVICE)
BILIRUBIN URINE: NEGATIVE
GLUCOSE, UA: NEGATIVE mg/dL
Hgb urine dipstick: NEGATIVE
NITRITE: NEGATIVE
PH: 7 (ref 5.0–8.0)
Protein, ur: 30 mg/dL — AB
Specific Gravity, Urine: 1.025 (ref 1.005–1.030)
Urobilinogen, UA: 1 mg/dL (ref 0.0–1.0)

## 2015-11-05 NOTE — Progress Notes (Signed)
Subjective:  Amy Cohen is a 25 y.o. G1P0000 at [redacted]w[redacted]d being seen today for ongoing prenatal care.  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  Patient reports no complaints.  Contractions: Irregular. Vag. Bleeding: None.  Movement: Present. Denies leaking of fluid.   The following portions of the patient's history were reviewed and updated as appropriate: allergies, current medications, past family history, past medical history, past social history, past surgical history and problem list. Problem list updated.  Objective:   Filed Vitals:   11/05/15 1550  BP: 130/69  Pulse: 84  Temp: 98.2 F (36.8 C)  Weight: 140 lb 8 oz (63.73 kg)    Fetal Status: Fetal Heart Rate (bpm): 145 Fundal Height: 36 cm Movement: Present  Presentation: Vertex  General:  Alert, oriented and cooperative. Patient is in no acute distress.  Skin: Skin is warm and dry. No rash noted.   Cardiovascular: Normal heart rate noted  Respiratory: Normal respiratory effort, no problems with respiration noted  Abdomen: Soft, gravid, appropriate for gestational age. Pain/Pressure: Present     Pelvic: Vag. Bleeding: None     Cervical exam deferred        Extremities: Normal range of motion.  Edema: None  Mental Status: Normal mood and affect. Normal behavior. Normal judgment and thought content.   Urinalysis: Urine Protein: 1+ Urine Glucose: Negative  Assessment and Plan:  Pregnancy: G1P0000 at [redacted]w[redacted]d  1. Encounter for supervision of normal first pregnancy in third trimester - Reviewed GBS results  Term labor symptoms and general obstetric precautions including but not limited to vaginal bleeding, contractions, leaking of fluid and fetal movement were reviewed in detail with the patient. Please refer to  After Visit Summary for other counseling recommendations.  Return in about 1 week (around 11/12/2015).   Eino Farber Kennith Gain, CNM

## 2015-11-12 ENCOUNTER — Encounter: Payer: BC Managed Care – PPO | Admitting: Advanced Practice Midwife

## 2015-11-14 ENCOUNTER — Ambulatory Visit (INDEPENDENT_AMBULATORY_CARE_PROVIDER_SITE_OTHER): Payer: BC Managed Care – PPO | Admitting: Family

## 2015-11-14 VITALS — BP 112/70 | HR 85 | Wt 142.9 lb

## 2015-11-14 DIAGNOSIS — Z3403 Encounter for supervision of normal first pregnancy, third trimester: Secondary | ICD-10-CM

## 2015-11-14 DIAGNOSIS — R8271 Bacteriuria: Secondary | ICD-10-CM

## 2015-11-14 LAB — POCT URINALYSIS DIP (DEVICE)
Bilirubin Urine: NEGATIVE
GLUCOSE, UA: NEGATIVE mg/dL
Hgb urine dipstick: NEGATIVE
Ketones, ur: NEGATIVE mg/dL
NITRITE: NEGATIVE
PROTEIN: NEGATIVE mg/dL
Specific Gravity, Urine: 1.015 (ref 1.005–1.030)
UROBILINOGEN UA: 0.2 mg/dL (ref 0.0–1.0)
pH: 7 (ref 5.0–8.0)

## 2015-11-14 NOTE — Progress Notes (Signed)
Large leukocytes in urine.  

## 2015-11-14 NOTE — Progress Notes (Signed)
Subjective:  Amy Cohen is a 25 y.o. G1P0000 at [redacted]w[redacted]d being seen today for ongoing prenatal care.  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  Patient reports no complaints.  Denies UTI symptoms.   Contractions: Irregular. Vag. Bleeding: None.  Movement: Present. Denies leaking of fluid.   The following portions of the patient's history were reviewed and updated as appropriate: allergies, current medications, past family history, past medical history, past social history, past surgical history and problem list. Problem list updated.  Objective:   Filed Vitals:   11/14/15 0843  BP: 112/70  Pulse: 85  Weight: 142 lb 14.4 oz (64.819 kg)    Fetal Status: Fetal Heart Rate (bpm): 145 Fundal Height: 37 cm Movement: Present  Presentation: Vertex  General:  Alert, oriented and cooperative. Patient is in no acute distress.  Skin: Skin is warm and dry. No rash noted.   Cardiovascular: Normal heart rate noted  Respiratory: Normal respiratory effort, no problems with respiration noted  Abdomen: Soft, gravid, appropriate for gestational age. Pain/Pressure: Present     Pelvic: Vag. Bleeding: None     Cervical exam performed Dilation: 1 Effacement (%): 50 Station: -3  Extremities: Normal range of motion.  Edema: None  Mental Status: Normal mood and affect. Normal behavior. Normal judgment and thought content.   Urinalysis: Urine Protein: Negative Urine Glucose: Negative  Assessment and Plan:  Pregnancy: G1P0000 at [redacted]w[redacted]d  1. Encounter for supervision of normal first pregnancy in third trimester - Reviewed signs of labor  2. Bacteria in urine - Urine culture sent - Culture, OB Urine  Term labor symptoms and general obstetric precautions including but not limited to vaginal  bleeding, contractions, leaking of fluid and fetal movement were reviewed in detail with the patient. Please refer to After Visit Summary for other counseling recommendations.  Return in about 1 week (around 11/21/2015).   Eino Farber Kennith Gain, CNM

## 2015-11-19 ENCOUNTER — Ambulatory Visit (INDEPENDENT_AMBULATORY_CARE_PROVIDER_SITE_OTHER): Payer: BC Managed Care – PPO | Admitting: Advanced Practice Midwife

## 2015-11-19 VITALS — BP 118/68 | HR 87 | Wt 141.6 lb

## 2015-11-19 DIAGNOSIS — Z3403 Encounter for supervision of normal first pregnancy, third trimester: Secondary | ICD-10-CM | POA: Diagnosis not present

## 2015-11-19 LAB — POCT URINALYSIS DIP (DEVICE)
BILIRUBIN URINE: NEGATIVE
GLUCOSE, UA: NEGATIVE mg/dL
Hgb urine dipstick: NEGATIVE
NITRITE: NEGATIVE
Protein, ur: NEGATIVE mg/dL
Specific Gravity, Urine: 1.02 (ref 1.005–1.030)
Urobilinogen, UA: 1 mg/dL (ref 0.0–1.0)
pH: 7 (ref 5.0–8.0)

## 2015-11-19 NOTE — Progress Notes (Signed)
Subjective:  Amy Cohen is a 25 y.o. G1P0000 at [redacted]w[redacted]d being seen today for ongoing prenatal care.  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  Patient reports no complaints.  Contractions: Irregular. Vag. Bleeding: None.  Movement: Present. Denies leaking of fluid.   The following portions of the patient's history were reviewed and updated as appropriate: allergies, current medications, past family history, past medical history, past social history, past surgical history and problem list. Problem list updated.  Objective:   Filed Vitals:   11/19/15 1541  BP: 118/68  Pulse: 87  Weight: 141 lb 9.6 oz (64.229 kg)    Fetal Status: Fetal Heart Rate (bpm): 130 Fundal Height: 38 cm Movement: Present     General:  Alert, oriented and cooperative. Patient is in no acute distress.  Skin: Skin is warm and dry. No rash noted.   Cardiovascular: Normal heart rate noted  Respiratory: Normal respiratory effort, no problems with respiration noted  Abdomen: Soft, gravid, appropriate for gestational age. Pain/Pressure: Present     Pelvic: Vag. Bleeding: None     Cervical exam performed Dilation: 2.5 Effacement (%): 60 Station: -2  Extremities: Normal range of motion.  Edema: None  Mental Status: Normal mood and affect. Normal behavior. Normal judgment and thought content.   Urinalysis:      Assessment and Plan:  Pregnancy: G1P0000 at [redacted]w[redacted]d  1. Encounter for supervision of normal first pregnancy in third trimester   Term labor symptoms and general obstetric precautions including but not limited to vaginal bleeding, contractions, leaking of fluid and fetal movement were reviewed in detail with the patient. Please refer to After Visit Summary for other counseling recommendations.  Return  in about 1 week (around 11/26/2015).   Hurshel Party, CNM

## 2015-11-26 ENCOUNTER — Ambulatory Visit (INDEPENDENT_AMBULATORY_CARE_PROVIDER_SITE_OTHER): Payer: BC Managed Care – PPO | Admitting: Obstetrics and Gynecology

## 2015-11-26 VITALS — BP 128/81 | HR 76 | Temp 98.5°F | Wt 145.5 lb

## 2015-11-26 DIAGNOSIS — Z36 Encounter for antenatal screening of mother: Secondary | ICD-10-CM

## 2015-11-26 DIAGNOSIS — O368131 Decreased fetal movements, third trimester, fetus 1: Secondary | ICD-10-CM

## 2015-11-26 DIAGNOSIS — O368130 Decreased fetal movements, third trimester, not applicable or unspecified: Secondary | ICD-10-CM

## 2015-11-26 LAB — POCT URINALYSIS DIP (DEVICE)
BILIRUBIN URINE: NEGATIVE
Glucose, UA: NEGATIVE mg/dL
HGB URINE DIPSTICK: NEGATIVE
KETONES UR: NEGATIVE mg/dL
Nitrite: NEGATIVE
PH: 6.5 (ref 5.0–8.0)
Protein, ur: NEGATIVE mg/dL
SPECIFIC GRAVITY, URINE: 1.025 (ref 1.005–1.030)
Urobilinogen, UA: 0.2 mg/dL (ref 0.0–1.0)

## 2015-11-26 NOTE — Progress Notes (Signed)
Subjective:  Amy Cohen is a 25 y.o. G1P0000 at [redacted]w[redacted]d being seen today for ongoing prenatal care.  40.0 weeks today best EGA by 23 wk [place]. 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  Patient reports no complaints.  Contractions: Irregular. Vag. Bleeding: None.  Movement: (!) Decreased. Less FM x 2-3 days, maximum about 3 movements/hr. Denies leaking of fluid.   The following portions of the patient's history were reviewed and updated as appropriate: allergies, current medications, past family history, past medical history, past social history, past surgical history and problem list. Problem list updated.  Objective:   Filed Vitals:   11/26/15 1559  BP: 128/81  Pulse: 76  Temp: 98.5 F (36.9 C)  Weight: 145 lb 8 oz (65.998 kg)    Fetal Status: Fetal Heart Rate (bpm): 121   Movement: (!) Decreased     General:  Alert, oriented and cooperative. Patient is in no acute distress.  Skin: Skin is warm and dry. No rash noted.   Cardiovascular: Normal heart rate noted  Respiratory: Normal respiratory effort, no problems with respiration noted  Abdomen: Soft, gravid, appropriate for gestational age. Pain/Pressure: Present     Pelvic: Vag. Bleeding: None     Cervical exam performed        Extremities: Normal range of motion.  Edema: None  Mental Status: Normal mood and affect. Normal behavior. Normal judgment and thought content.   Urinalysis: mod LE still (C&S neg 11/14/15)   Assessment and Plan:  Pregnancy: G1P0 at [redacted]w[redacted]d BEGA by 23 wk scan S<D but presenting part low and decreased FM> Diane Day RN did AFI= 11.6 NST reactive  There are no diagnoses linked to this encounter. Term labor symptoms and general obstetric precautions including but not limited to vaginal bleeding,  contractions, leaking of fluid and fetal movement were reviewed in detail with the patient. Please refer to After Visit Summary for other counseling recommendations.  Return for as scheduled.   Danae Orleans, CNM

## 2015-11-26 NOTE — Patient Instructions (Signed)
Fetal Movement Counts  Patient Name: __________________________________________________ Patient Due Date: ____________________  Performing a fetal movement count is highly recommended in high-risk pregnancies, but it is good for every pregnant woman to do. Your health care provider may ask you to start counting fetal movements at 28 weeks of the pregnancy. Fetal movements often increase:  · After eating a full meal.  · After physical activity.  · After eating or drinking something sweet or cold.  · At rest.  Pay attention to when you feel the baby is most active. This will help you notice a pattern of your baby's sleep and wake cycles and what factors contribute to an increase in fetal movement. It is important to perform a fetal movement count at the same time each day when your baby is normally most active.   HOW TO COUNT FETAL MOVEMENTS  1. Find a quiet and comfortable area to sit or lie down on your left side. Lying on your left side provides the best blood and oxygen circulation to your baby.  2. Write down the day and time on a sheet of paper or in a journal.  3. Start counting kicks, flutters, swishes, rolls, or jabs in a 2-hour period. You should feel at least 10 movements within 2 hours.  4. If you do not feel 10 movements in 2 hours, wait 2-3 hours and count again. Look for a change in the pattern or not enough counts in 2 hours.  SEEK MEDICAL CARE IF:  · You feel less than 10 counts in 2 hours, tried twice.  · There is no movement in over an hour.  · The pattern is changing or taking longer each day to reach 10 counts in 2 hours.  · You feel the baby is not moving as he or she usually does.  Date: ____________ Movements: ____________ Start time: ____________ Finish time: ____________   Date: ____________ Movements: ____________ Start time: ____________ Finish time: ____________  Date: ____________ Movements: ____________ Start time: ____________ Finish time: ____________  Date: ____________ Movements:  ____________ Start time: ____________ Finish time: ____________  Date: ____________ Movements: ____________ Start time: ____________ Finish time: ____________  Date: ____________ Movements: ____________ Start time: ____________ Finish time: ____________  Date: ____________ Movements: ____________ Start time: ____________ Finish time: ____________  Date: ____________ Movements: ____________ Start time: ____________ Finish time: ____________   Date: ____________ Movements: ____________ Start time: ____________ Finish time: ____________  Date: ____________ Movements: ____________ Start time: ____________ Finish time: ____________  Date: ____________ Movements: ____________ Start time: ____________ Finish time: ____________  Date: ____________ Movements: ____________ Start time: ____________ Finish time: ____________  Date: ____________ Movements: ____________ Start time: ____________ Finish time: ____________  Date: ____________ Movements: ____________ Start time: ____________ Finish time: ____________  Date: ____________ Movements: ____________ Start time: ____________ Finish time: ____________   Date: ____________ Movements: ____________ Start time: ____________ Finish time: ____________  Date: ____________ Movements: ____________ Start time: ____________ Finish time: ____________  Date: ____________ Movements: ____________ Start time: ____________ Finish time: ____________  Date: ____________ Movements: ____________ Start time: ____________ Finish time: ____________  Date: ____________ Movements: ____________ Start time: ____________ Finish time: ____________  Date: ____________ Movements: ____________ Start time: ____________ Finish time: ____________  Date: ____________ Movements: ____________ Start time: ____________ Finish time: ____________   Date: ____________ Movements: ____________ Start time: ____________ Finish time: ____________  Date: ____________ Movements: ____________ Start time: ____________ Finish  time: ____________  Date: ____________ Movements: ____________ Start time: ____________ Finish time: ____________  Date: ____________ Movements: ____________ Start time:   ____________ Finish time: ____________  Date: ____________ Movements: ____________ Start time: ____________ Finish time: ____________  Date: ____________ Movements: ____________ Start time: ____________ Finish time: ____________  Date: ____________ Movements: ____________ Start time: ____________ Finish time: ____________   Date: ____________ Movements: ____________ Start time: ____________ Finish time: ____________  Date: ____________ Movements: ____________ Start time: ____________ Finish time: ____________  Date: ____________ Movements: ____________ Start time: ____________ Finish time: ____________  Date: ____________ Movements: ____________ Start time: ____________ Finish time: ____________  Date: ____________ Movements: ____________ Start time: ____________ Finish time: ____________  Date: ____________ Movements: ____________ Start time: ____________ Finish time: ____________  Date: ____________ Movements: ____________ Start time: ____________ Finish time: ____________   Date: ____________ Movements: ____________ Start time: ____________ Finish time: ____________  Date: ____________ Movements: ____________ Start time: ____________ Finish time: ____________  Date: ____________ Movements: ____________ Start time: ____________ Finish time: ____________  Date: ____________ Movements: ____________ Start time: ____________ Finish time: ____________  Date: ____________ Movements: ____________ Start time: ____________ Finish time: ____________  Date: ____________ Movements: ____________ Start time: ____________ Finish time: ____________  Date: ____________ Movements: ____________ Start time: ____________ Finish time: ____________   Date: ____________ Movements: ____________ Start time: ____________ Finish time: ____________  Date: ____________  Movements: ____________ Start time: ____________ Finish time: ____________  Date: ____________ Movements: ____________ Start time: ____________ Finish time: ____________  Date: ____________ Movements: ____________ Start time: ____________ Finish time: ____________  Date: ____________ Movements: ____________ Start time: ____________ Finish time: ____________  Date: ____________ Movements: ____________ Start time: ____________ Finish time: ____________  Date: ____________ Movements: ____________ Start time: ____________ Finish time: ____________   Date: ____________ Movements: ____________ Start time: ____________ Finish time: ____________  Date: ____________ Movements: ____________ Start time: ____________ Finish time: ____________  Date: ____________ Movements: ____________ Start time: ____________ Finish time: ____________  Date: ____________ Movements: ____________ Start time: ____________ Finish time: ____________  Date: ____________ Movements: ____________ Start time: ____________ Finish time: ____________  Date: ____________ Movements: ____________ Start time: ____________ Finish time: ____________     This information is not intended to replace advice given to you by your health care provider. Make sure you discuss any questions you have with your health care provider.     Document Released: 10/28/2006 Document Revised: 10/19/2014 Document Reviewed: 07/25/2012  Elsevier Interactive Patient Education ©2016 [employer]

## 2015-11-29 ENCOUNTER — Ambulatory Visit (INDEPENDENT_AMBULATORY_CARE_PROVIDER_SITE_OTHER): Payer: BC Managed Care – PPO | Admitting: *Deleted

## 2015-11-29 ENCOUNTER — Telehealth (HOSPITAL_COMMUNITY): Payer: Self-pay | Admitting: *Deleted

## 2015-11-29 ENCOUNTER — Encounter (HOSPITAL_COMMUNITY): Payer: Self-pay | Admitting: *Deleted

## 2015-11-29 VITALS — BP 126/79 | HR 80

## 2015-11-29 DIAGNOSIS — O480 Post-term pregnancy: Secondary | ICD-10-CM

## 2015-11-29 NOTE — Progress Notes (Signed)
IOL scheduled 2/22

## 2015-11-29 NOTE — Telephone Encounter (Signed)
Preadmission screen  

## 2015-11-29 NOTE — Progress Notes (Signed)
NST performed today was reviewed and was found to be reactive.  Continue recommended antenatal testing and prenatal care.  

## 2015-12-04 ENCOUNTER — Inpatient Hospital Stay (HOSPITAL_COMMUNITY): Payer: BC Managed Care – PPO | Admitting: Anesthesiology

## 2015-12-04 ENCOUNTER — Encounter (HOSPITAL_COMMUNITY): Payer: Self-pay | Admitting: Certified Registered Nurse Anesthetist

## 2015-12-04 ENCOUNTER — Inpatient Hospital Stay (HOSPITAL_COMMUNITY)
Admission: RE | Admit: 2015-12-04 | Discharge: 2015-12-06 | DRG: 775 | Disposition: A | Discharge status: Home / Self Care | Payer: BC Managed Care – PPO | Source: Ambulatory Visit | Attending: Obstetrics and Gynecology | Admitting: Obstetrics and Gynecology

## 2015-12-04 ENCOUNTER — Encounter (HOSPITAL_COMMUNITY): Payer: Self-pay

## 2015-12-04 DIAGNOSIS — Z87891 Personal history of nicotine dependence: Secondary | ICD-10-CM | POA: Diagnosis not present

## 2015-12-04 DIAGNOSIS — O99824 Streptococcus B carrier state complicating childbirth: Secondary | ICD-10-CM | POA: Diagnosis present

## 2015-12-04 DIAGNOSIS — Z3A41 41 weeks gestation of pregnancy: Secondary | ICD-10-CM

## 2015-12-04 DIAGNOSIS — Z8249 Family history of ischemic heart disease and other diseases of the circulatory system: Secondary | ICD-10-CM | POA: Diagnosis not present

## 2015-12-04 DIAGNOSIS — O480 Post-term pregnancy: Secondary | ICD-10-CM | POA: Diagnosis present

## 2015-12-04 DIAGNOSIS — IMO0001 Reserved for inherently not codable concepts without codable children: Secondary | ICD-10-CM

## 2015-12-04 LAB — TYPE AND SCREEN
ABO/RH(D): O POS
ANTIBODY SCREEN: NEGATIVE

## 2015-12-04 LAB — RPR: RPR: NONREACTIVE

## 2015-12-04 LAB — CBC
HEMATOCRIT: 35.7 % — AB (ref 36.0–46.0)
HEMOGLOBIN: 11.9 g/dL — AB (ref 12.0–15.0)
MCH: 24.5 pg — ABNORMAL LOW (ref 26.0–34.0)
MCHC: 33.3 g/dL (ref 30.0–36.0)
MCV: 73.5 fL — AB (ref 78.0–100.0)
Platelets: 184 10*3/uL (ref 150–400)
RBC: 4.86 MIL/uL (ref 3.87–5.11)
RDW: 15.7 % — AB (ref 11.5–15.5)
WBC: 12.2 10*3/uL — AB (ref 4.0–10.5)

## 2015-12-04 LAB — ABO/RH: ABO/RH(D): O POS

## 2015-12-04 MED ORDER — OXYTOCIN 10 UNIT/ML IJ SOLN: 2.5000 [IU]/h | INTRAVENOUS | Status: DC

## 2015-12-04 MED ORDER — ACETAMINOPHEN 325 MG PO TABS: 650.0000 mg | ORAL_TABLET | ORAL | Status: DC | PRN

## 2015-12-04 MED ORDER — OXYCODONE-ACETAMINOPHEN 5-325 MG PO TABS: 1.0000 | ORAL_TABLET | ORAL | Status: DC | PRN

## 2015-12-04 MED ORDER — PHENYLEPHRINE 40 MCG/ML (10ML) SYRINGE FOR IV PUSH (FOR BLOOD PRESSURE SUPPORT)
PREFILLED_SYRINGE | INTRAVENOUS | Status: AC
  Filled 2015-12-04: qty 20

## 2015-12-04 MED ORDER — PENICILLIN G POTASSIUM 5000000 UNITS IJ SOLR
2.5000 10*6.[IU] | INTRAVENOUS | Status: DC
  Filled 2015-12-04 (×3): qty 2.5

## 2015-12-04 MED ORDER — ONDANSETRON HCL 4 MG/2ML IJ SOLN: 4.0000 mg | INTRAMUSCULAR | Status: DC | PRN

## 2015-12-04 MED ORDER — ONDANSETRON HCL 4 MG PO TABS: 4.0000 mg | ORAL_TABLET | ORAL | Status: DC | PRN

## 2015-12-04 MED ORDER — ONDANSETRON HCL 4 MG/2ML IJ SOLN: 4.0000 mg | Freq: Four times a day (QID) | INTRAMUSCULAR | Status: DC | PRN

## 2015-12-04 MED ORDER — CITRIC ACID-SODIUM CITRATE 334-500 MG/5ML PO SOLN: 30.0000 mL | ORAL | Status: DC | PRN

## 2015-12-04 MED ORDER — OXYTOCIN BOLUS FROM INFUSION: 500.0000 mL | INTRAVENOUS | Status: DC

## 2015-12-04 MED ORDER — LANOLIN HYDROUS EX OINT: TOPICAL_OINTMENT | CUTANEOUS | Status: DC | PRN

## 2015-12-04 MED ORDER — FENTANYL CITRATE (PF) 100 MCG/2ML IJ SOLN
100.0000 ug | INTRAMUSCULAR | Status: DC | PRN
  Administered 2015-12-04: 100 ug via INTRAVENOUS
  Filled 2015-12-04: qty 2

## 2015-12-04 MED ORDER — MEASLES, MUMPS & RUBELLA VAC ~~LOC~~ INJ
0.5000 mL | INJECTION | Freq: Once | SUBCUTANEOUS | Status: DC
  Filled 2015-12-04: qty 0.5

## 2015-12-04 MED ORDER — IBUPROFEN 600 MG PO TABS
600.0000 mg | ORAL_TABLET | Freq: Four times a day (QID) | ORAL | Status: DC
  Administered 2015-12-04 – 2015-12-06 (×8): 600 mg via ORAL
  Filled 2015-12-04 (×9): qty 1

## 2015-12-04 MED ORDER — PHENYLEPHRINE 40 MCG/ML (10ML) SYRINGE FOR IV PUSH (FOR BLOOD PRESSURE SUPPORT)
80.0000 ug | PREFILLED_SYRINGE | INTRAVENOUS | Status: DC | PRN
  Filled 2015-12-04: qty 2

## 2015-12-04 MED ORDER — PENICILLIN G POTASSIUM 5000000 UNITS IJ SOLR
2.5000 10*6.[IU] | INTRAVENOUS | Status: DC
  Filled 2015-12-04 (×4): qty 2.5

## 2015-12-04 MED ORDER — OXYCODONE-ACETAMINOPHEN 5-325 MG PO TABS: 2.0000 | ORAL_TABLET | ORAL | Status: DC | PRN

## 2015-12-04 MED ORDER — DIBUCAINE 1 % RE OINT: 1.0000 "application " | TOPICAL_OINTMENT | RECTAL | Status: DC | PRN

## 2015-12-04 MED ORDER — ZOLPIDEM TARTRATE 5 MG PO TABS: 5.0000 mg | ORAL_TABLET | Freq: Every evening | ORAL | Status: DC | PRN

## 2015-12-04 MED ORDER — METHYLERGONOVINE MALEATE 0.2 MG/ML IJ SOLN: 0.2000 mg | INTRAMUSCULAR | Status: DC | PRN

## 2015-12-04 MED ORDER — TETANUS-DIPHTH-ACELL PERTUSSIS 5-2.5-18.5 LF-MCG/0.5 IM SUSP: 0.5000 mL | Freq: Once | INTRAMUSCULAR | Status: DC

## 2015-12-04 MED ORDER — WITCH HAZEL-GLYCERIN EX PADS: 1.0000 "application " | MEDICATED_PAD | CUTANEOUS | Status: DC | PRN

## 2015-12-04 MED ORDER — LACTATED RINGERS IV SOLN
2.5000 [IU]/h | INTRAVENOUS | Status: DC
  Filled 2015-12-04: qty 4

## 2015-12-04 MED ORDER — PENICILLIN G POTASSIUM 5000000 UNITS IJ SOLR
5.0000 10*6.[IU] | Freq: Once | INTRAVENOUS | Status: DC
  Filled 2015-12-04: qty 5

## 2015-12-04 MED ORDER — EPHEDRINE 5 MG/ML INJ: 10.0000 mg | INTRAVENOUS | Status: DC | PRN

## 2015-12-04 MED ORDER — SIMETHICONE 80 MG PO CHEW: 80.0000 mg | CHEWABLE_TABLET | ORAL | Status: DC | PRN

## 2015-12-04 MED ORDER — FLEET ENEMA 7-19 GM/118ML RE ENEM: 1.0000 | ENEMA | RECTAL | Status: DC | PRN

## 2015-12-04 MED ORDER — DIBUCAINE 1 % RE OINT
1.0000 | TOPICAL_OINTMENT | RECTAL | Status: DC | PRN
Start: 2015-12-04 — End: 2015-12-06

## 2015-12-04 MED ORDER — LIDOCAINE HCL (PF) 1 % IJ SOLN
INTRAMUSCULAR | Status: DC | PRN
  Administered 2015-12-04 (×2): 6 mL

## 2015-12-04 MED ORDER — MISOPROSTOL 25 MCG QUARTER TABLET
25.0000 ug | ORAL_TABLET | ORAL | Status: DC | PRN
  Administered 2015-12-04: 25 ug via VAGINAL
  Filled 2015-12-04: qty 0.25
  Filled 2015-12-04: qty 1

## 2015-12-04 MED ORDER — DIPHENHYDRAMINE HCL 25 MG PO CAPS: 25.0000 mg | ORAL_CAPSULE | Freq: Four times a day (QID) | ORAL | Status: DC | PRN

## 2015-12-04 MED ORDER — LACTATED RINGERS IV SOLN
INTRAVENOUS | Status: DC
Start: 2015-12-04 — End: 2015-12-04
  Administered 2015-12-04 (×2): via INTRAVENOUS

## 2015-12-04 MED ORDER — PRENATAL MULTIVITAMIN CH
1.0000 | ORAL_TABLET | Freq: Every day | ORAL | Status: DC
  Administered 2015-12-04 – 2015-12-05 (×2): 1 via ORAL
  Filled 2015-12-04 (×3): qty 1

## 2015-12-04 MED ORDER — BENZOCAINE-MENTHOL 20-0.5 % EX AERO: 1.0000 "application " | INHALATION_SPRAY | CUTANEOUS | Status: DC | PRN

## 2015-12-04 MED ORDER — BENZOCAINE-MENTHOL 20-0.5 % EX AERO
1.0000 | INHALATION_SPRAY | CUTANEOUS | Status: DC | PRN
Start: 2015-12-04 — End: 2015-12-06
  Administered 2015-12-04: 1 via TOPICAL
  Filled 2015-12-04: qty 56

## 2015-12-04 MED ORDER — BISACODYL 10 MG RE SUPP: 10.0000 mg | Freq: Every day | RECTAL | Status: DC | PRN

## 2015-12-04 MED ORDER — DIPHENHYDRAMINE HCL 50 MG/ML IJ SOLN: 12.5000 mg | INTRAMUSCULAR | Status: DC | PRN

## 2015-12-04 MED ORDER — LACTATED RINGERS IV SOLN
500.0000 mL | Freq: Once | INTRAVENOUS | Status: DC
Start: 2015-12-04 — End: 2015-12-04

## 2015-12-04 MED ORDER — PENICILLIN G POTASSIUM 5000000 UNITS IJ SOLR
2.5000 10*6.[IU] | INTRAVENOUS | Status: DC
  Filled 2015-12-04 (×2): qty 2.5

## 2015-12-04 MED ORDER — EPHEDRINE 5 MG/ML INJ
10.0000 mg | INTRAVENOUS | Status: DC | PRN
  Filled 2015-12-04: qty 2

## 2015-12-04 MED ORDER — LIDOCAINE HCL (PF) 1 % IJ SOLN
30.0000 mL | INTRAMUSCULAR | Status: DC | PRN
  Filled 2015-12-04: qty 30

## 2015-12-04 MED ORDER — FENTANYL 2.5 MCG/ML BUPIVACAINE 1/10 % EPIDURAL INFUSION (WH - ANES)
INTRAMUSCULAR | Status: AC
  Filled 2015-12-04: qty 125

## 2015-12-04 MED ORDER — METHYLERGONOVINE MALEATE 0.2 MG PO TABS: 0.2000 mg | ORAL_TABLET | ORAL | Status: DC | PRN

## 2015-12-04 MED ORDER — IBUPROFEN 600 MG PO TABS: 600.0000 mg | ORAL_TABLET | Freq: Four times a day (QID) | ORAL | Status: DC

## 2015-12-04 MED ORDER — LACTATED RINGERS IV SOLN: 2.5000 [IU]/h | INTRAVENOUS | Status: DC | PRN

## 2015-12-04 MED ORDER — FENTANYL 2.5 MCG/ML BUPIVACAINE 1/10 % EPIDURAL INFUSION (WH - ANES)
14.0000 mL/h | INTRAMUSCULAR | Status: DC | PRN
  Administered 2015-12-04: 14 mL/h via EPIDURAL

## 2015-12-04 MED ORDER — FLEET ENEMA 7-19 GM/118ML RE ENEM: 1.0000 | ENEMA | Freq: Every day | RECTAL | Status: DC | PRN

## 2015-12-04 MED ORDER — PHENYLEPHRINE 40 MCG/ML (10ML) SYRINGE FOR IV PUSH (FOR BLOOD PRESSURE SUPPORT): 80.0000 ug | PREFILLED_SYRINGE | INTRAVENOUS | Status: DC | PRN

## 2015-12-04 MED ORDER — LACTATED RINGERS IV SOLN: INTRAVENOUS | Status: DC

## 2015-12-04 MED ORDER — LACTATED RINGERS IV SOLN: 500.0000 mL | Freq: Once | INTRAVENOUS | Status: DC

## 2015-12-04 MED ORDER — MISOPROSTOL 25 MCG QUARTER TABLET
25.0000 ug | ORAL_TABLET | ORAL | Status: DC | PRN
  Administered 2015-12-04: 25 ug via VAGINAL
  Filled 2015-12-04: qty 0.25

## 2015-12-04 MED ORDER — DOCUSATE SODIUM 100 MG PO CAPS
100.0000 mg | ORAL_CAPSULE | Freq: Two times a day (BID) | ORAL | Status: DC
  Administered 2015-12-04 – 2015-12-05 (×3): 100 mg via ORAL
  Filled 2015-12-04 (×4): qty 1

## 2015-12-04 MED ORDER — LIDOCAINE HCL (PF) 1 % IJ SOLN: 30.0000 mL | INTRAMUSCULAR | Status: DC | PRN

## 2015-12-04 MED ORDER — LACTATED RINGERS IV SOLN: 500.0000 mL | INTRAVENOUS | Status: DC | PRN

## 2015-12-04 MED ORDER — LACTATED RINGERS IV SOLN
500.0000 mL | INTRAVENOUS | Status: DC | PRN
  Administered 2015-12-04: 1000 mL via INTRAVENOUS

## 2015-12-04 MED ORDER — SENNOSIDES-DOCUSATE SODIUM 8.6-50 MG PO TABS: 2.0000 | ORAL_TABLET | ORAL | Status: DC

## 2015-12-04 MED ORDER — PRENATAL MULTIVITAMIN CH: 1.0000 | ORAL_TABLET | Freq: Every day | ORAL | Status: DC

## 2015-12-04 MED ORDER — TERBUTALINE SULFATE 1 MG/ML IJ SOLN
0.2500 mg | Freq: Once | INTRAMUSCULAR | Status: DC | PRN
  Filled 2015-12-04: qty 1

## 2015-12-04 MED ORDER — TERBUTALINE SULFATE 1 MG/ML IJ SOLN: 0.2500 mg | Freq: Once | INTRAMUSCULAR | Status: DC | PRN

## 2015-12-04 MED ORDER — DEXTROSE 5 % IV SOLN
5.0000 10*6.[IU] | Freq: Once | INTRAVENOUS | Status: AC
  Administered 2015-12-04: 5 10*6.[IU] via INTRAVENOUS
  Filled 2015-12-04: qty 5

## 2015-12-04 NOTE — Anesthesia Postprocedure Evaluation (Signed)
Anesthesia Post Note  Patient: Amy Cohen  Procedure(s) Performed: * No procedures listed *  Anesthesia Post Evaluation  Last Vitals:  Filed Vitals:   12/04/15 1155 12/04/15 1330  BP: 122/68 124/80  Pulse: 90 98  Temp: 36.7 C 37.1 C  Resp: 18 18    Last Pain:  Filed Vitals:   12/04/15 1345  PainSc: 1                  [name]

## 2015-12-04 NOTE — Addendum Note (Signed)
Addendum  created 12/04/15 1542 by Renford Dills, CRNA   Modules edited: Clinical Notes   Clinical Notes:  File: [id number]

## 2015-12-04 NOTE — Anesthesia Postprocedure Evaluation (Signed)
Anesthesia Post Note  Patient: Amy Cohen  Procedure(s) Performed: * No procedures listed *  Patient location during evaluation: Mother Baby Anesthesia Type: Epidural Level of consciousness: awake Pain management: pain level controlled Vital Signs Assessment: post-procedure vital signs reviewed and stable Respiratory status: spontaneous breathing Cardiovascular status: stable Postop Assessment: no headache, no backache, epidural receding, patient able to bend at knees, no signs of nausea or vomiting and adequate PO intake Anesthetic complications: no    Last Vitals:  Filed Vitals:   12/04/15 1155 12/04/15 1330  BP: 122/68 124/80  Pulse: 90 98  Temp: 36.7 C 37.1 C  Resp: 18 18    Last Pain:  Filed Vitals:   12/04/15 1523  PainSc: 0-No pain                 Angie Piercey

## 2015-12-04 NOTE — H&P (Signed)
Amy Cohen is a 25 y.o. female G1P0000 with IUP at [redacted]w[redacted]d by 23wk U/s presenting for IOL secondary to post dates. She sporadically has contractions. No vaginal bleeding, discharge, LOF. Good fetal movement. Very comfortable right now.   [facility] at [facility] since 23 wks  Prenatal History/Complications: None  Past Medical History: History reviewed. No pertinent past medical history.  Past Surgical History: Past Surgical History  Procedure Laterality Date  . Wisdom tooth extraction      Obstetrical History: OB History    Gravida Para Term Preterm AB TAB SAB Ectopic Multiple Living        Social History: Social History   Social History  . Marital Status: Single    Spouse Name: N/A  . Number of Children: N/A  . Years of Education: N/A   Social History Main Topics  . Smoking status: Former Smoker -- 0.25 packs/day for 3 years    Types: Cigarettes    Quit date: 04/11/2015  . Smokeless tobacco: Never Used  . Alcohol Use: 13.8 oz/week    2 Cans of beer, 21 Shots of liquor per week  . Drug Use: No  . Sexual Activity: No   Other Topics Concern  . None   Social History Narrative    Family History: Family History  Problem Relation Age of Onset  . Autism Brother   . Cancer Maternal Aunt   . Heart disease Maternal Grandfather   . Hypertension Paternal Grandmother     Allergies: No Known Allergies  Prescriptions prior to admission  Medication Sig Dispense Refill Last Dose  . Misc. Devices (BREAST PUMP) MISC Dispense one breast pump for patient (Patient not taking: Reported on 11/14/2015) 1 each 0 Not Taking  . Prenatal Vit-Fe Fum-FA-Omega (ONE-A-DAY WOMENS PRENATAL PO) Take 1 capsule by mouth daily.   Taking  . terconazole (TERAZOL 7) 0.4 % vaginal cream Place 1 applicator vaginally at bedtime. (Patient not taking: Reported on 11/14/2015) 45 g 0 Not Taking     Review of Systems   Constitutional: no fevers, chills, fatigue, headaches, chest pain, SOB.    Blood pressure 124/70, pulse 91, temperature 98.5 F (36.9 C), temperature source Oral, resp. rate 16, height  (1.753 m), weight 145 lb (65.772 kg), last menstrual period 01/27/2015. General appearance: alert, cooperative and appears stated age Lungs: clear to auscultation bilaterally Heart: regular rate and rhythm Abdomen: soft, non-tender; bowel sounds normal Pelvic: adequate Extremities: Homans sign is negative, no sign of DVT DTR's 1+ Presentation: cephalic Fetal monitoringBaseline: 125 bpm, Variability: Good {> 6 bpm), Accelerations: Reactive and Decelerations: Absent Uterine activity irritability  Dilation: 2 Effacement (%): 50 Station: -3 Exam by:: Dr. Alvester Morin   Prenatal labs: ABO, Rh: --/--/O POS (02/22 0100) Antibody: NEG (02/22 0100) Rubella: !Error! RPR: NON REAC (11/23 1142)  HBsAg: NEGATIVE (10/19 1027)  HIV: NONREACTIVE (11/23 1142)  GBS: Positive (01/10 0000)  1 hr Glucola 93 Genetic screening  Too late Anatomy US EFW 70%, normal female   Clinic [facility] Prenatal Labs  Dating 23 week scan Blood type: O/POS/-- (10/19 1027)   Genetic Screen Late to care Antibody:NEG (10/19 1027)  Anatomic [place] Inadequate at 23 w [x ] repeat at 27w  Never done.>> done at 35 wks, normal  Female. Rubella: 15.60 (10/19 1027)  GTT   Third trimester: 93 RPR: NON REAC (10/19 1027)   Flu vaccine 07/2015 HBsAg: NEGATIVE (10/19 1027)   TDaP vaccine 09/10/15  HIV: NONREACTIVE (10/19 1027)   Baby Food    Breast                                GBS: Pos. Done at 35wk  +  Contraception  Undecided Pap:  Negative; 07/2015  Circumcision NA   Pediatrician List given   Support Person Maybe mom     Prenatal Transfer Tool  Maternal Diabetes: No Genetic Screening: Normal Maternal Ultrasounds/Referrals: Normal Fetal Ultrasounds or other Referrals:  None Maternal Substance Abuse:  No Significant Maternal Medications:  None Significant Maternal Lab Results:  Lab values include: Group B Strep positive    Results for orders placed or performed during the hospital encounter of 12/04/15 (from the past 24 hour(s))  CBC   Collection Time: 12/04/15  1:00 AM  Result Value Ref Range   WBC 12.2 (H) 4.0 - 10.5 K/uL   RBC 4.86 3.87 - 5.11 MIL/uL   Hemoglobin 11.9 (L) 12.0 - 15.0 g/dL   HCT 40.9 (L) 81.1 - 91.4 %   MCV 73.5 (L) 78.0 - 100.0 fL   MCH 24.5 (L) 26.0 - 34.0 pg   MCHC 33.3 30.0 - 36.0 g/dL   RDW 78.2 (H) 95.6 - 21.3 %   Platelets 184 150 - 400 K/uL  Type and screen [facility]   Collection Time: 12/04/15  1:00 AM  Result Value Ref Range   ABO/RH(D) O POS    Antibody Screen NEG    Sample Expiration 12/07/2015     Assessment: Amy Cohen is a 25 y.o. G1P0000 at [redacted]w[redacted]d by 23 wk U/S presenting for IOL due to post dates #Labor: Cytotec vaginally q4hrs #Pain: Without medications currently, may require epidural in future #FWB: Cat 1 #ID:  GBS pos- intrapartum penicillin #MOF: Breast #MOC: Possibly Depo #Circ:  N/A, girl "Amy Cohen"  Rodrigo Ran PGY2  OB fellow attestation: I have seen and examined this patient; I agree with above documentation in the resident's note.   Amy Cohen is a 25 y.o. G1P0000 here for IOl for postdates  PE: BP 124/70 mmHg  Pulse 91  Temp(Src) 98.5 F (36.9 C) (Oral)  Resp 16  Ht  (1.753 m)  Wt 145 lb (65.772 kg)  BMI 21.40 kg/m2  LMP 01/27/2015 (Exact Date) Gen: calm comfortable, NAD Resp: normal effort, no distress Abd: gravid  ROS, labs, PMH reviewed  Plan: Admit to LD Plan for cytotec and then possible FB Cat I, reactive strip GBS pos, to start at 0600  Federico Flake, MD  Family Medicine, OB Fellow 12/04/2015, 3:01 AM

## 2015-12-04 NOTE — Anesthesia Procedure Notes (Signed)
Epidural Patient location during procedure: OB  Staffing Anesthesiologist: Kevis Qu Performed by: anesthesiologist   Preanesthetic Checklist Completed: patient identified, site marked, surgical consent, pre-op evaluation, timeout performed, IV checked, risks and benefits discussed and monitors and equipment checked  Epidural Patient position: sitting Prep: DuraPrep Patient monitoring: heart rate and blood pressure Approach: midline Location: L3-L4 Injection technique: LOR saline  Needle:  Needle type: Tuohy  Needle gauge: 17 G Needle insertion depth: 5 cm Catheter type: closed end Catheter size: 19 Gauge Catheter at skin depth: 10 cm Test dose: negative  Assessment Events: blood not aspirated, injection not painful, no injection resistance, negative IV test and no paresthesia  Additional Notes Reason for block:procedure for pain   

## 2015-12-04 NOTE — Progress Notes (Signed)
   Amy Cohen is a 25 y.o. G1P0000 at [redacted]w[redacted]d  admitted for induction of labor due to post dates .  Subjective: Patient very uncomfortable since her 2nd dose of cytotec at 530.   Objective: Filed Vitals:   12/04/15 0049 12/04/15 0050 12/04/15 0432 12/04/15 0609  BP:  124/70 112/71 126/73  Pulse:  91 92 91  Temp: 98.5 F (36.9 C)  98.7 F (37.1 C) 98.6 F (37 C)  TempSrc: Oral  Oral Oral  Resp: Height:  (1.753 m)     Weight: 65.772 kg (145 lb)         FHT:  FHR: 120 bpm, variability: moderate,  accelerations:  Present,  decelerations:  Absent UC:   regular, every 2-3 minutes SVE:   Dilation: 7.5 Effacement (%): 100 Station: +1 Exam by:: hayes   Labs: Lab Results  Component Value Date   WBC 12.2* 12/04/2015   HGB 11.9* 12/04/2015   HCT 35.7* 12/04/2015   MCV 73.5* 12/04/2015   PLT 184 12/04/2015    Assessment / Plan: IOL due to post-dates   Labor: Progressing rapidly on 2 cytotec. Will continue to monitor change and consider pitocin as needed Fetal Wellbeing:  Category I Pain Control:  will be getting epidural Anticipated MOD:  NSVD  Arthella Headings 12/04/2015, 8:41 AM

## 2015-12-04 NOTE — Anesthesia Preprocedure Evaluation (Addendum)
Anesthesia Evaluation  Patient identified by MRN, date of birth, ID band Patient awake    Reviewed: Allergy & Precautions, NPO status , Patient's Chart, lab work & pertinent test results  Airway Mallampati: II  TM Distance: >3 FB Neck ROM: Full    Dental no notable dental hx.    Pulmonary former smoker,    Pulmonary exam normal breath sounds clear to auscultation       Cardiovascular negative cardio ROS Normal cardiovascular exam Rhythm:Regular Rate:Normal     Neuro/Psych negative neurological ROS  negative psych ROS   GI/Hepatic negative GI ROS, Neg liver ROS,   Endo/Other  negative endocrine ROS  Renal/GU negative Renal ROS  negative genitourinary   Musculoskeletal negative musculoskeletal ROS (+)   Abdominal   Peds negative pediatric ROS (+)  Hematology negative hematology ROS (+)   Anesthesia Other Findings   Reproductive/Obstetrics negative OB ROS                            Anesthesia Physical Anesthesia Plan  ASA: II  Anesthesia Plan: Epidural   Post-op Pain Management:    Induction: Intravenous  Airway Management Planned: Natural Airway  Additional Equipment:   Intra-op Plan:   Post-operative Plan:   Informed Consent: I have reviewed the patients History and Physical, chart, labs and discussed the procedure including the risks, benefits and alternatives for the proposed anesthesia with the patient or authorized representative who has indicated his/her understanding and acceptance.   Dental advisory given  Plan Discussed with: CRNA  Anesthesia Plan Comments: (Informed consent obtained prior to proceeding including risk of failure, 1% risk of PDPH, risk of minor discomfort and bruising.  Discussed rare but serious complications including epidural abscess, permanent nerve injury, epidural hematoma.  Discussed alternatives to epidural analgesia and patient desires to  proceed.  Timeout performed pre-procedure verifying patient name, procedure, and platelet count.  Patient tolerated procedure well. )      Anesthesia Quick Evaluation  

## 2015-12-04 NOTE — Anesthesia Postprocedure Evaluation (Signed)
Anesthesia Post Note  Patient: Amy Cohen  Procedure(s) Performed: * No procedures listed *  Patient location during evaluation: Mother Baby Anesthesia Type: Epidural Level of consciousness: awake and alert and oriented Pain management: pain level controlled Vital Signs Assessment: post-procedure vital signs reviewed and stable Respiratory status: spontaneous breathing Cardiovascular status: blood pressure returned to baseline and stable Postop Assessment: patient able to bend at knees, adequate PO intake and no headache Anesthetic complications: no    Last Vitals:  Filed Vitals:   12/04/15 1058 12/04/15 1102  BP: 130/73 130/86  Pulse: 89 91  Temp:    Resp: 18 18    Last Pain:  Filed Vitals:   12/04/15 1336  PainSc: 1                  Dhruvi Crenshaw R

## 2015-12-04 NOTE — Lactation Note (Signed)
This note was copied from a baby's chart. Lactation Consultation Note  Patient Name: Amy Cohen [id number] Date: 12/04/2015 Reason for consult: Initial assessment Baby at 9 hr of life and mom reports that baby is not latching. Baby was tongue sucking and finger sucking. Demonstrated suck training. Assisted mom in the cross cradle position and baby latched easily. Demonstrated manual expression, no colostrum noted. Encouraged mom to keep practicing manual expression. She has a spoon in the room. Discussed baby behavior, feeding frequency, baby belly size, voids, wt loss, breast changes, and nipple care. Given lactation handouts. Aware of OP services and support group.      Maternal Data Has patient been taught Hand Expression?: Yes Does the patient have breastfeeding experience prior to this delivery?: No  Feeding Feeding Type: Breast Fed Length of feed: 10 min  LATCH Score/Interventions Latch: Repeated attempts needed to sustain latch, nipple held in mouth throughout feeding, stimulation needed to elicit sucking reflex. Intervention(s): Adjust position;Assist with latch;Breast compression  Audible Swallowing: None Intervention(s): Skin to skin;Hand expression Intervention(s): Alternate breast massage  Type of Nipple: Everted at rest and after stimulation  Comfort (Breast/Nipple): Soft / non-tender     Hold (Positioning): Assistance needed to correctly position infant at breast and maintain latch. Intervention(s): Support Pillows;Position options  LATCH Score: 6  Lactation Tools Discussed/Used WIC Program: Yes   Consult Status Consult Status: Follow-up Date: 12/05/15 Follow-up type: In-patient    Amy Cohen 12/04/2015, 7:00 PM

## 2015-12-04 NOTE — Anesthesia Postprocedure Evaluation (Incomplete)
Anesthesia Post Note  Patient: Amy Cohen  Procedure(s) Performed: * No procedures listed *  Anesthesia Post Evaluation  Last Vitals:  Filed Vitals:   12/04/15 1155 12/04/15 1330  BP: 122/68 124/80  Pulse: 90 98  Temp: 36.7 C 37.1 C  Resp: 18 18    Last Pain:  Filed Vitals:   12/04/15 1345  PainSc: 1                  Kaylynne Andres R      

## 2015-12-04 NOTE — Consults (Signed)
  Anesthesia Pain Consult Note  Patient: Amy Cohen, 25 y.o., female  Consult Requested by: Catalina Antigua, MD  Reason for Consult: CRNA Pain rounds  Level of Consciousness: alert  Pain: moderate   Pain Goal: 7

## 2015-12-05 MED ORDER — MENTHOL 3 MG MT LOZG
1.0000 | LOZENGE | OROMUCOSAL | Status: DC | PRN
  Administered 2015-12-05: 3 mg via ORAL
  Filled 2015-12-05: qty 9

## 2015-12-05 MED ORDER — MENTHOL 3 MG MT LOZG: 1.0000 | LOZENGE | OROMUCOSAL | Status: DC | PRN

## 2015-12-05 NOTE — Progress Notes (Signed)
CSW received consult for late prenatal care at 26 weeks and history of etoh use in first trimester.  CSW screening out referral at this time since late prenatal care is identified as initiating care after 28 weeks.  No documented concerns of alcohol use as pregnancy progressed or of alcohol abuse.   Re-consult CSW if needs arise.

## 2015-12-05 NOTE — Progress Notes (Signed)
Post Partum Day #1 Subjective: no complaints, up ad lib, voiding, tolerating PO and + flatus. Patient is doing well. No overnight events. VB is decreasing and burning with urination she noted yesterday has also ceased. Denies HA or dizziness.   Objective: Blood pressure 105/60, pulse 86, temperature 97.8 F (36.6 C), temperature source Oral, resp. rate 18, height  (1.753 m), weight 65.772 kg (145 lb), last menstrual period 01/27/2015, SpO2 99 %, unknown if currently breastfeeding.  Physical Exam:  General: alert, cooperative and no distress Uterine Fundus: firm Incision: NA DVT Evaluation: No evidence of DVT seen on physical exam. Negative Homan's sign. No cords or calf tenderness. No significant calf/ankle edema.   Recent Labs  12/04/15 0100  HGB 11.9*  HCT 35.7*    Assessment/Plan: Plan for discharge tomorrow, Breastfeeding and Contraception Depo   LOS: 1 day   Senaida Lange 12/05/2015, 7:43 AM

## 2015-12-05 NOTE — Lactation Note (Signed)
This note was copied from a baby's chart. Lactation Consultation Note  Patient Name: Amy Cohen [id number] Date: 12/05/2015 Reason for consult: Follow-up assessment Mom reports she feels baby is nursing well, reports some mild nipple tenderness, no trauma reported. LC advised Mom to apply EBM to sore nipples. Advised baby should be at the breast 8-12 times or more in 24 hours and with feeding ques. Encouraged to call for assist with latch as needed or if nipple tenderness is getting worse. Mom denies other questions/concerns.   Maternal Data    Feeding Feeding Type: Breast Fed Length of feed: 40 min  LATCH Score/Interventions                      Lactation Tools Discussed/Used     Consult Status Consult Status: Follow-up Date: 12/06/15 Follow-up type: In-patient    Alfred Levins 12/05/2015, 3:45 PM

## 2015-12-06 MED ORDER — IBUPROFEN 600 MG PO TABS: 600.0000 mg | ORAL_TABLET | Freq: Four times a day (QID) | ORAL | Status: DC

## 2015-12-06 NOTE — Discharge Summary (Signed)
OB Discharge Summary     Patient Name: Amy Cohen DOB: Jul 11, 1991 MRN: [id number]  Date of admission: 12/04/2015 Delivering MD: Shonna Chock BED[place]ORD   Date of discharge: 12/06/2015  Admitting diagnosis: INDUCTION Intrauterine pregnancy: [redacted]w[redacted]d     Secondary diagnosis:  Active Problems:   Post term pregnancy   Active labor  Additional problems: none     Discharge diagnosis: Term Pregnancy Delivered                                                                                                Post partum procedures:na  Augmentation: Cytotec  Complications: None  Hospital course:  Induction of Labor With Vaginal Delivery   25 y.o. yo G1P1001 at 104w1d was admitted to the hospital 12/04/2015 for induction of labor.  Indication for induction: Postdates.  Patient had an uncomplicated labor course as follows: Membrane Rupture Time/Date: 8:52 AM ,12/04/2015   Intrapartum Procedures: Episiotomy: None [1]                                         Lacerations:  1st degree [2];Periurethral [8]  Patient had delivery of a Viable infant.  Information for the patient's newborn:  [place], [place] [[id number]]  Delivery Method: Vaginal, Spontaneous Delivery (Filed from Delivery Summary)   12/04/2015  Details of delivery can be found in separate delivery note.  Patient had a routine postpartum course. Patient is discharged home 12/06/2015.   Physical exam  Filed Vitals:   12/05/15 0049 12/05/15 0518 12/05/15 1849 12/06/15 0620  BP: 103/57 105/60 120/55 126/59  Pulse: 85 86 78 70  Temp: 98.3 F (36.8 C) 97.8 F (36.6 C) 98.3 F (36.8 C) 98.2 F (36.8 C)  TempSrc: Oral Oral Oral   Resp:   19 18  Height:      Weight:      SpO2: 100% 99%     General: alert, cooperative and no distress Lochia: appropriate Uterine Fundus: firm Incision: N/A DVT Evaluation: No evidence of DVT seen on physical exam. Labs: Lab Results  Component Value Date   WBC 12.2* 12/04/2015   HGB 11.9* 12/04/2015    HCT 35.7* 12/04/2015   MCV 73.5* 12/04/2015   PLT 184 12/04/2015   No flowsheet data found.  Discharge instruction: per After Visit Summary and "Baby and Me Booklet".  After visit meds:    Medication List    TAKE these medications        ibuprofen 600 MG tablet  Commonly known as:  ADVIL,MOTRIN  Take 1 tablet (600 mg total) by mouth every 6 (six) hours.     ONE-A-DAY WOMENS PRENATAL PO  Take 1 capsule by mouth daily.        Diet: routine diet  Activity: Advance as tolerated. Pelvic rest for 6 weeks.   Outpatient follow up:as scheduled Follow up Appt:Future Appointments Date Time Provider Department Center  01/06/2016 12:45 PM Adam Phenix, MD [facility]   Follow up Visit:No Follow-up on file.  Postpartum contraception:  Depo Provera  Newborn Data: Live born female  Birth Weight: 7 lb 1 oz (3204 g) APGAR: 7, 9  Baby Feeding: Breast Disposition:home with mother   12/06/2015 CRESENZO-DISHMAN,Gerrell Tabet, CNM   h h

## 2015-12-06 NOTE — Lactation Note (Signed)
This note was copied from a baby's chart. Lactation Consultation Note  Patient Name: [name] [id number] Date: 12/06/2015 Reason for consult: Follow-up assessment  LC noted with chart review that baby had not BF from 2315 on 2/23 to 0800 on 2/24. Mom reports she attempted to BF at 0225 but baby had spitty/choking episode and would not latch. First time baby would latch again was at 0800 on 2/24. Per chart review baby has been to breast 7 times in past 24 hours for 15-25 minutes. 3 voids/2 stools in 24 hours, 6 voids/5 stools in life, 6 % weight loss, baby now 21 hours old. Basic teaching reviewed with Mom, advised baby should be at the breast 8-12 times in 24 hours and with feeding ques to encourage milk production and minimize weight loss. Baby awake and latched without much assist at this visit.  Mom reports some tenderness with initial latch that improves with baby nursing. Advised to apply EBM, Mom has comfort gels, reviewed use. Assisted Mom at this visit to obtain more depth with latch, demonstrated how to use breast compression for depth and how to bring bottom lip down with baby nursing. Mom denied discomfort and Mom's nipple was round when baby came off the breast. Encouraged to alternate positions with BF to help with soreness. Engorgement care reviewed if needed, hand pump demonstrated to use if needed at home. Discussed pump/storage for return to work. Advised of OP services and support group.   Maternal Data    Feeding Feeding Type: Breast Fed Length of feed: 25 min  LATCH Score/Interventions Latch: Grasps breast easily, tongue down, lips flanged, rhythmical sucking. Intervention(s): Assist with latch;Breast massage;Breast compression  Audible Swallowing: A few with stimulation Intervention(s): Hand expression Intervention(s): Hand expression  Type of Nipple: Everted at rest and after stimulation  Comfort (Breast/Nipple): Filling, red/small blisters or bruises, mild/mod  discomfort  Problem noted: Mild/Moderate discomfort Interventions (Mild/moderate discomfort): Comfort gels;Hand massage;Hand expression (EBM to sore nipples)  Hold (Positioning): Assistance needed to correctly position infant at breast and maintain latch. Intervention(s): Breastfeeding basics reviewed;Support Pillows;Position options;Skin to skin  LATCH Score: 7  Lactation Tools Discussed/Used Tools: Pump Breast pump type: Manual   Consult Status Consult Status: Complete Date: 12/06/15 Follow-up type: In-patient    Alfred Levins 12/06/2015, 10:00 AM

## 2015-12-06 NOTE — Discharge Instructions (Signed)

## 2016-01-06 ENCOUNTER — Ambulatory Visit (INDEPENDENT_AMBULATORY_CARE_PROVIDER_SITE_OTHER): Payer: BC Managed Care – PPO | Admitting: Obstetrics & Gynecology

## 2016-01-06 ENCOUNTER — Encounter: Payer: Self-pay | Admitting: Obstetrics & Gynecology

## 2016-01-06 DIAGNOSIS — Z3042 Encounter for surveillance of injectable contraceptive: Secondary | ICD-10-CM | POA: Diagnosis not present

## 2016-01-06 MED ORDER — MEDROXYPROGESTERONE ACETATE 150 MG/ML IM SUSP
150.0000 mg | Freq: Once | INTRAMUSCULAR | Status: AC
  Administered 2016-01-06: 150 mg via INTRAMUSCULAR

## 2016-01-06 MED ORDER — MEDROXYPROGESTERONE ACETATE 150 MG/ML IM SUSP
150.0000 mg | INTRAMUSCULAR | Status: AC
  Administered 2016-11-24 – 2017-10-19 (×4): 150 mg via INTRAMUSCULAR

## 2016-01-06 NOTE — Progress Notes (Signed)
Patient ID: Amy Cohen Risinger, female   DOB: 06/01/1991, 25 y.o.   MRN: [id number] Subjective:     Amy Cohen Creeden is a 25 y.o. female who presents for a postpartum visit. She is 6 weeks postpartum following a vaginal delivery 12/04/2015. I have fully reviewed the prenatal and intrapartum course. The delivery was at [redacted] weeks  gestational weeks. Outcome: Delivery. Anesthesia: Epidural. Postpartum course has been completed. Baby's course has been completed. Baby is feeding by breast. Bleeding not at this time. Bowel function is normal. Bladder function is normal. Patient is not sexually active. Contraception method is Depo Provera. Postpartum depression screening:negative.  The following portions of the patient's history were reviewed and updated as appropriate: allergies, current medications, past family history, past medical history, past social history, past surgical history and problem list.  Review of Systems Pertinent items are noted in HPI.   Objective:    BP 116/74 mmHg  Pulse 82  Temp(Src) 98.5 F (36.9 C)  Wt 125 lb 3.2 oz (56.79 kg)  General:  alert, cooperative and no distress           Abdomen: non distended   Vulva:  not evaluated  Vagina: not evaluated                    Assessment:     normal postpartum exam. Pap smear not done at today's visit.   Plan:    1. Contraception: Depo-Provera injections 2. Given today 3. Follow up in: 3 months or as needed.    Adam PhenixJames G Arnold, MD

## 2016-08-25 ENCOUNTER — Ambulatory Visit (INDEPENDENT_AMBULATORY_CARE_PROVIDER_SITE_OTHER): Payer: BC Managed Care – PPO | Admitting: Advanced Practice Midwife

## 2016-08-25 VITALS — BP 123/81 | HR 77 | Ht 68.0 in | Wt 121.0 lb

## 2016-08-25 DIAGNOSIS — Z7289 Other problems related to lifestyle: Secondary | ICD-10-CM

## 2016-08-25 DIAGNOSIS — R5383 Other fatigue: Secondary | ICD-10-CM

## 2016-08-25 DIAGNOSIS — Z01419 Encounter for gynecological examination (general) (routine) without abnormal findings: Secondary | ICD-10-CM

## 2016-08-25 DIAGNOSIS — Z113 Encounter for screening for infections with a predominantly sexual mode of transmission: Secondary | ICD-10-CM

## 2016-08-25 DIAGNOSIS — F1090 Alcohol use, unspecified, uncomplicated: Secondary | ICD-10-CM

## 2016-08-25 LAB — TSH: TSH: 2.29 mIU/L

## 2016-08-25 LAB — COMPREHENSIVE METABOLIC PANEL
ALT: 49 U/L — ABNORMAL HIGH (ref 6–29)
AST: 47 U/L — ABNORMAL HIGH (ref 10–30)
Albumin: 4.6 g/dL (ref 3.6–5.1)
Alkaline Phosphatase: 47 U/L (ref 33–115)
BUN: 8 mg/dL (ref 7–25)
CHLORIDE: 103 mmol/L (ref 98–110)
CO2: 27 mmol/L (ref 20–31)
CREATININE: 0.74 mg/dL (ref 0.50–1.10)
Calcium: 9.6 mg/dL (ref 8.6–10.2)
Glucose, Bld: 60 mg/dL — ABNORMAL LOW (ref 65–99)
POTASSIUM: 4 mmol/L (ref 3.5–5.3)
SODIUM: 138 mmol/L (ref 135–146)
Total Bilirubin: 0.8 mg/dL (ref 0.2–1.2)
Total Protein: 7.6 g/dL (ref 6.1–8.1)

## 2016-08-25 LAB — CBC
HCT: 42.5 % (ref 35.0–45.0)
Hemoglobin: 13.4 g/dL (ref 11.7–15.5)
MCH: 25.2 pg — AB (ref 27.0–33.0)
MCHC: 31.5 g/dL — AB (ref 32.0–36.0)
MCV: 79.9 fL — AB (ref 80.0–100.0)
MPV: 10 fL (ref 7.5–12.5)
PLATELETS: 248 10*3/uL (ref 140–400)
RBC: 5.32 MIL/uL — ABNORMAL HIGH (ref 3.80–5.10)
RDW: 14.4 % (ref 11.0–15.0)
WBC: 6.5 10*3/uL (ref 3.8–10.8)

## 2016-08-25 LAB — WET PREP, GENITAL
TRICH WET PREP: NONE SEEN
YEAST WET PREP: NONE SEEN

## 2016-08-25 LAB — HIV ANTIBODY (ROUTINE TESTING W REFLEX): HIV 1&2 Ab, 4th Generation: NONREACTIVE

## 2016-08-25 LAB — HEPATITIS C ANTIBODY: HCV AB: NEGATIVE

## 2016-08-25 NOTE — Progress Notes (Signed)
Subjective:    Amy Cohen is a 25 y.o. female who presents for an annual exam. The patient has no complaints today. She came in for Depo shot but had unprotected intercourse in last few days.  She is not due for a Pap but reports she has been drinking too much lately and has decided to stop and just wants a general check up.  She also desires STD testing.  The patient is sexually active. GYN screening history: last pap: was normal.  She reports intermittent fatigue in recent months.   Menstrual History: OB History    Gravida Para Term Preterm AB Living   '1 1 1 '$ 0 0 1   SAB TAB Ectopic Multiple Live Births   0 0 0 0 1       Patient's last menstrual period was 07/30/2016.    The following portions of the patient's history were reviewed and updated as appropriate: allergies, current medications, past family history, past medical history, past social history, past surgical history and problem list.  Review of Systems Pertinent items are noted in HPI.    Objective:   BP 123/81   Pulse 77   Ht '5\' 8"'$  (1.727 m)   Wt 121 lb (54.9 kg)   LMP 07/30/2016   BMI 18.40 kg/m  CONSTITUTIONAL: Well-developed, well-nourished female in no acute distress.  HENT:  Normocephalic, atraumatic, External right and left ear normal. Oropharynx is clear and moist EYES: Conjunctivae and EOM are normal. Pupils are equal, round, and reactive to light. No scleral icterus.  NECK: Normal range of motion, supple, no masses.  Normal thyroid.  SKIN: Skin is warm and dry. No rash noted. Not diaphoretic. No erythema. No pallor. [place]: Alert and oriented to person, place, and time. Normal reflexes, muscle tone coordination. No cranial nerve deficit noted. PSYCHIATRIC: Normal mood and affect. Normal behavior. Normal judgment and thought content. CARDIOVASCULAR: Normal heart rate noted, regular rhythm RESPIRATORY: Clear to auscultation bilaterally. Effort and breath sounds normal, no problems with respiration  noted. BREASTS: Symmetric in size. No masses, skin changes, nipple drainage, or lymphadenopathy. ABDOMEN: Soft, normal bowel sounds, no distention noted.  No tenderness, rebound or guarding.  PELVIC: Normal appearing external genitalia; normal appearing vaginal mucosa and cervix.  Normal appearing discharge.  Pap smear obtained.  Normal uterine size, no other palpable masses, no uterine or adnexal tenderness. MUSCULOSKELETAL: Normal range of motion. No tenderness.  No cyanosis, clubbing, or edema.  2+ distal pulses.   Assessment:   1. Well woman exam with routine gynecological exam  --Pt to return in 2 weeks for Depo Provera injection - CBC - Comp Met (CMET) - RPR - HIV antibody (with reflex) - Hepatitis C antibody - Wet prep, genital - GC/Chlamydia Probe Amp  2. Other fatigue  - TSH  3. Alcohol problem drinking --Discussed with pt, she reports she is doing better now but there was a time when it was more of a problem.  Recommend she seek help as needed. - Comp Met (CMET)    Plan:     Contraception: none. Return in 2 weeks for Depo   Amy Cohen, Amy Cohen, CNM 9:42 AM

## 2016-08-26 LAB — GC/CHLAMYDIA PROBE AMP (~~LOC~~) NOT AT ARMC
CHLAMYDIA, DNA PROBE: NEGATIVE
NEISSERIA GONORRHEA: NEGATIVE

## 2016-08-26 LAB — RPR

## 2016-09-08 ENCOUNTER — Ambulatory Visit (INDEPENDENT_AMBULATORY_CARE_PROVIDER_SITE_OTHER): Payer: BC Managed Care – PPO | Admitting: *Deleted

## 2016-09-08 VITALS — BP 130/69 | HR 76 | Wt 125.1 lb

## 2016-09-08 DIAGNOSIS — Z3042 Encounter for surveillance of injectable contraceptive: Secondary | ICD-10-CM

## 2016-09-08 MED ORDER — MEDROXYPROGESTERONE ACETATE 150 MG/ML IM SUSP
150.0000 mg | Freq: Once | INTRAMUSCULAR | Status: AC
  Administered 2016-09-08: 150 mg via INTRAMUSCULAR

## 2016-11-24 ENCOUNTER — Ambulatory Visit (INDEPENDENT_AMBULATORY_CARE_PROVIDER_SITE_OTHER): Payer: BC Managed Care – PPO

## 2016-11-24 ENCOUNTER — Ambulatory Visit: Payer: BC Managed Care – PPO

## 2016-11-24 DIAGNOSIS — Z3042 Encounter for surveillance of injectable contraceptive: Secondary | ICD-10-CM | POA: Diagnosis not present

## 2016-12-01 ENCOUNTER — Ambulatory Visit: Payer: BC Managed Care – PPO | Admitting: Obstetrics and Gynecology

## 2017-02-09 ENCOUNTER — Ambulatory Visit (INDEPENDENT_AMBULATORY_CARE_PROVIDER_SITE_OTHER): Payer: BC Managed Care – PPO | Admitting: *Deleted

## 2017-02-09 VITALS — BP 136/82 | HR 73 | Wt 125.7 lb

## 2017-02-09 DIAGNOSIS — Z3042 Encounter for surveillance of injectable contraceptive: Secondary | ICD-10-CM | POA: Diagnosis not present

## 2017-02-09 NOTE — Progress Notes (Signed)
Depo Provera administered as scheduled.  Next dose due 7/17-7/31.

## 2017-04-28 ENCOUNTER — Ambulatory Visit: Payer: BC Managed Care – PPO

## 2017-08-03 ENCOUNTER — Encounter: Payer: Self-pay | Admitting: Advanced Practice Midwife

## 2017-08-03 ENCOUNTER — Ambulatory Visit (INDEPENDENT_AMBULATORY_CARE_PROVIDER_SITE_OTHER): Payer: BC Managed Care – PPO | Admitting: Advanced Practice Midwife

## 2017-08-03 VITALS — BP 121/77 | HR 75 | Wt 128.6 lb

## 2017-08-03 DIAGNOSIS — Z3042 Encounter for surveillance of injectable contraceptive: Secondary | ICD-10-CM

## 2017-08-03 DIAGNOSIS — Z01419 Encounter for gynecological examination (general) (routine) without abnormal findings: Secondary | ICD-10-CM | POA: Diagnosis not present

## 2017-08-03 DIAGNOSIS — Z113 Encounter for screening for infections with a predominantly sexual mode of transmission: Secondary | ICD-10-CM

## 2017-08-03 DIAGNOSIS — Z3202 Encounter for pregnancy test, result negative: Secondary | ICD-10-CM | POA: Diagnosis not present

## 2017-08-03 DIAGNOSIS — N760 Acute vaginitis: Secondary | ICD-10-CM

## 2017-08-03 DIAGNOSIS — Z124 Encounter for screening for malignant neoplasm of cervix: Secondary | ICD-10-CM | POA: Diagnosis not present

## 2017-08-03 DIAGNOSIS — N898 Other specified noninflammatory disorders of vagina: Secondary | ICD-10-CM | POA: Diagnosis not present

## 2017-08-03 DIAGNOSIS — B9689 Other specified bacterial agents as the cause of diseases classified elsewhere: Secondary | ICD-10-CM

## 2017-08-03 LAB — POCT PREGNANCY, URINE: PREG TEST UR: NEGATIVE

## 2017-08-03 MED ORDER — METRONIDAZOLE 500 MG PO TABS: 500.0000 mg | ORAL_TABLET | Freq: Two times a day (BID) | ORAL | 2 refills | Status: DC

## 2017-08-03 NOTE — Progress Notes (Signed)
Subjective:    Amy Cohen is a 26 y.o. female who presents for an annual exam, STD screening, and Depo injection. The patient reports intermittent vaginal discharge with odor, especially after intercourse and after her menses. The patient is sexually active. GYN screening history: last pap: was normal. The patient wears seatbelts: yes. The patient participates in regular exercise: yes.  The patient reports that there is not domestic violence in her life.   Menstrual History: OB History    Gravida Para Term Preterm AB Living   1 1 1  0 0 1   SAB TAB Ectopic Multiple Live Births   0 0 0 0 1       Patient's last menstrual period was 07/13/2017. Period Pattern: (!) Irregular  The following portions of the patient's history were reviewed and updated as appropriate: allergies, current medications, past family history, past medical history, past social history, past surgical history and problem list.  Review of Systems Pertinent items noted in HPI and remainder of comprehensive ROS otherwise negative.    Objective:   BP 121/77   Pulse 75   Wt 128 lb 9.6 oz (58.3 kg)   LMP 07/13/2017   BMI 19.55 kg/m  CONSTITUTIONAL: Well-developed, well-nourished female in no acute distress.  HENT:  Normocephalic, atraumatic, External right and left ear normal. Oropharynx is clear and moist EYES: Conjunctivae and EOM are normal. Pupils are equal, round, and reactive to light. No scleral icterus.  NECK: Normal range of motion, supple, no masses.  Normal thyroid.  SKIN: Skin is warm and dry. No rash noted. Not diaphoretic. No erythema. No pallor. NEUROLGIC: Alert and oriented to person, place, and time. Normal reflexes, muscle tone coordination. No cranial nerve deficit noted. PSYCHIATRIC: Normal mood and affect. Normal behavior. Normal judgment and thought content. CARDIOVASCULAR: Normal heart rate noted, regular rhythm RESPIRATORY: Clear to auscultation bilaterally. Effort and breath sounds normal,  no problems with respiration noted. BREASTS: Symmetric in size. No masses, skin changes, nipple drainage, or lymphadenopathy. ABDOMEN: Soft, normal bowel sounds, no distention noted.  No tenderness, rebound or guarding.  PELVIC: Normal appearing external genitalia; normal appearing vaginal mucosa and cervix.  Moderate amount thin watery discharge.  Pap smear obtained.  Normal uterine size, no other palpable masses, no uterine or adnexal tenderness. MUSCULOSKELETAL: Normal range of motion. No tenderness.  No cyanosis, clubbing, or edema.  2+ distal pulses.   Assessment:   1. Screening examination for STD (sexually transmitted disease)  - HIV antibody - Hepatitis B surface antigen - Hepatitis C Antibody - RPR - Cytology - PAP  2. Well woman exam with routine gynecological exam  - Cytology - PAP  3. Encounter for Depo-Provera contraception --Depo injection given by RN --Discussed alternatives, including LARCs with pt. Pt would like to continue Depo at this time.  4. BV (bacterial vaginosis) --Diagnoses based on history and clinical exam.  - metroNIDAZOLE (FLAGYL) 500 MG tablet; Take 1 tablet (500 mg total) by mouth 2 (two) times daily.  Dispense: 14 tablet; Refill: 2    Plan:     All questions answered. Await pap smear results. Blood tests: HIV, Hep C, Hep B, RPR. Breast self exam technique reviewed and patient encouraged to perform self-exam monthly. Contraception: Depo-Provera injections. Follow up in 3 months.   Sharen CounterLisa Leftwich-Kirby, CNM 3:03 PM

## 2017-08-03 NOTE — Progress Notes (Signed)
Last unprotected intercourse end of September.

## 2017-08-03 NOTE — Patient Instructions (Addendum)
Some natural remedies/prevention to try for bacterial vaginosis: Take a probiotic tablet/capsule every day for at least 1-2 months.   Whenever you have symptoms, use boric acid suppositories vaginally every night for a week.   Do not use scented soaps/perfumes in the vaginal area and wear breathable cotton underwear and do not wear tight restrictive clothing.  Preventing Cervical Cancer Cervical cancer is cancer that grows on the cervix. The cervix is at the bottom of the uterus. It connects the uterus to the vagina. The uterus is where a baby develops during pregnancy. Cancer occurs when cells become abnormal and start to grow out of control. Cervical cancer grows slowly and may not cause any symptoms at first. Over time, the cancer can grow deep into the cervix tissue and spread to other areas. If it is found early, cervical cancer can be treated effectively. You can also take steps to prevent this type of cancer. Most cases of cervical cancer are caused by an STI (sexually transmitted infection) called human papillomavirus (HPV). One way to reduce your risk of cervical cancer is to avoid infection with the HPV virus. You can do this by practicing safe sex and by getting the HPV vaccine. Getting regular Pap tests is also important because this can help identify changes in cells that could lead to cancer. Your chances of getting this disease can also be reduced by making certain lifestyle changes. How can I protect myself from cervical cancer? Preventing HPV infection  Ask your health care provider about getting the HPV vaccine. If you are 90 years old or younger, you may need to get this vaccine, which is given in three doses over 6 months. This vaccine protects against the types of HPV that could cause cancer.  Limit the number of people you have sex with. Also avoid having sex with people who have had many sex partners.  Use a latex condom during sex. Getting Pap tests  Get Pap tests  regularly, starting at age 41. Talk with your health care provider about how often you need these tests. ? Most women who are 52?28 years of age should have a Pap test every 3 years. ? Most women who are 1?28 years of age should have a Pap test in combination with an HPV test every 5 years. ? Women with a higher risk of cervical cancer, such as those with a weakened immune system or those who have been exposed to the drug diethylstilbestrol (DES), may need more frequent testing. Making other lifestyle changes  Do not use any products that contain nicotine or tobacco, such as cigarettes and e-cigarettes. If you need help quitting, ask your health care provider.  Eat at least 5 servings of fruits and vegetables every day.  Lose weight if you are overweight. Why are these changes important?  These changes and screening tests are designed to address the factors that are known to increase the risk of cervical cancer. Taking these steps is the best way to reduce your risk.  Having regular Pap tests will help identify changes in cells that could lead to cancer. Steps can then be taken to prevent cancer from developing.  These changes will also help find cervical cancer early. This type of cancer can be treated effectively if it is found early. It can be more dangerous and difficult to treat if cancer has grown deep into your cervix or has spread.  In addition to making you less likely to get cervical cancer, these changes will also  provide other health benefits, such as the following: ? Practicing safe sex is important for preventing STIs and unplanned pregnancies. ? Avoiding tobacco can reduce your risk for other cancers and health issues. ? Eating a healthy diet and maintaining a healthy weight are good for your overall health. What can happen if changes are not made? In the early stages, cervical cancer might not have any symptoms. It can take many years for the cancer to grow and get deep into  the cervix tissue. This may be happening without you knowing about it. If you develop any symptoms, such as pelvic pain or unusual discharge or bleeding from your vagina, you should see your health care provider right away. If cervical cancer is not found early, you might need treatments such as radiation, chemotherapy, or surgery. In some cases, surgery may mean that you will not be able to get pregnant or carry a pregnancy to term. Where to find support: Talk with your health care provider, school nurse, or local health department for guidance about screening and vaccination. Some children and teens may be able to get the HPV vaccine free of charge through the U.[place]. government's Vaccines for Children [facility](VFC) program. Other places that provide vaccinations include:  Public health clinics. Check with your local health department.  Federally [facility], where you would pay only what you can afford. To find one near you, check this website: http://weiss.info/www.fqhc.org/find-an-fqhc/  R[facility] Health Clinics. These are part of a program for Medicare and Medicaid patients who live in rural areas.  The National Breast and Cervical Cancer Early Detection Program also provides breast and cervical cancer screenings and diagnostic services to low-income, uninsured, and underinsured women. Cervical cancer can be passed down through families. Talk with your health care provider or genetic counselor to learn more about genetic testing for cancer. Where to find more information: Learn more about cervical cancer from:  [employer]: HardChecks.bewww.acog.org/Patients/FAQs/Cervical-Cancer  American Cancer Society: www.cancer.org/cancer/cervicalcancer/  U.S. Centers for Disease Control and Prevention: MedicationWarning.tnwww.cdc.gov/cancer/cervical/  Summary  Talk with your health care provider about getting the HPV vaccine.  Be sure to get regular Pap tests as recommended by your health care provider.  See your health care  provider right away if you have any pelvic pain or unusual discharge or bleeding from your vagina. This information is not intended to replace advice given to you by your health care provider. Make sure you discuss any questions you have with your health care provider. Document Released: 10/13/2015 Document Revised: 05/26/2016 Document Reviewed: 05/26/2016 Elsevier Interactive Patient Education  [employer]2018 Elsevier Inc.

## 2017-08-04 LAB — HIV ANTIBODY (ROUTINE TESTING W REFLEX): HIV Screen 4th Generation wRfx: NONREACTIVE

## 2017-08-04 LAB — HEPATITIS C ANTIBODY

## 2017-08-04 LAB — HEPATITIS B SURFACE ANTIGEN: Hepatitis B Surface Ag: NEGATIVE

## 2017-08-04 LAB — RPR: RPR Ser Ql: NONREACTIVE

## 2017-08-06 LAB — CYTOLOGY - PAP
Bacterial vaginitis: POSITIVE — AB
CANDIDA VAGINITIS: NEGATIVE
Chlamydia: NEGATIVE
Diagnosis: UNDETERMINED — AB
HPV: NOT DETECTED
Neisseria Gonorrhea: NEGATIVE
Trichomonas: NEGATIVE

## 2017-08-30 ENCOUNTER — Ambulatory Visit: Payer: Self-pay | Admitting: Family Medicine

## 2017-08-30 NOTE — Progress Notes (Deleted)
  No chief complaint on file.   HPI  4 review of systems  No past medical history on file.  Current Outpatient Medications  Medication Sig Dispense Refill  . ibuprofen (ADVIL,MOTRIN) 600 MG tablet Take 1 tablet (600 mg total) by mouth every 6 (six) hours. 30 tablet 0  . metroNIDAZOLE (FLAGYL) 500 MG tablet Take 1 tablet (500 mg total) by mouth 2 (two) times daily. 14 tablet 2  . Multiple Vitamin (MULTIVITAMIN) tablet Take 1 tablet by mouth daily.     Current Facility-Administered Medications  Medication Dose Route Frequency Provider Last Rate Last Dose  . medroxyPROGESTERone (DEPO-PROVERA) injection 150 mg  150 mg Intramuscular Q90 days Adam PhenixArnold, James G, MD   150 mg at 08/03/17 1450    Allergies: No Known Allergies  Past Surgical History:  Procedure Laterality Date  . WISDOM TOOTH EXTRACTION      Social History   Socioeconomic History  . Marital status: Single    Spouse name: Not on file  . Number of children: Not on file  . Years of education: Not on file  . Highest education level: Not on file  Social Needs  . Financial resource strain: Not on file  . Food insecurity - worry: Not on file  . Food insecurity - inability: Not on file  . Transportation needs - medical: Not on file  . Transportation needs - non-medical: Not on file  Occupational History  . Not on file  Tobacco Use  . Smoking status: Former Smoker    Packs/day: 0.25    Years: 3.00    Pack years: 0.75    Types: Cigarettes    Last attempt to quit: 04/11/2015    Years since quitting: 2.3  . Smokeless tobacco: Never Used  Substance and Sexual Activity  . Alcohol use: Yes    Alcohol/week: 13.8 oz    Types: 2 Cans of beer, 21 Shots of liquor per week  . Drug use: No  . Sexual activity: Yes    Birth control/protection: None  Other Topics Concern  . Not on file  Social History Narrative  . Not on file    Family History  Problem Relation Age of Onset  . Bursitis Mother   . Vitiligo Mother   .  Autism Brother   . Heart disease Maternal Grandfather   . Hypertension Paternal Grandmother   . Cancer Maternal Aunt      ROS Review of Systems See HPI Constitution: No fevers or chills No malaise No diaphoresis Skin: No rash or itching Eyes: no blurry vision, no double vision GU: no dysuria or hematuria Neuro: no dizziness or headaches * all others reviewed and negative   Objective: There were no vitals filed for this visit.  Physical Exam  Assessment and Plan There are no diagnoses linked to this encounter.   Amy Cohen PPL Corporationaddy

## 2017-09-01 ENCOUNTER — Ambulatory Visit: Payer: BC Managed Care – PPO | Admitting: Emergency Medicine

## 2017-09-01 ENCOUNTER — Other Ambulatory Visit: Payer: Self-pay

## 2017-09-01 ENCOUNTER — Encounter: Payer: Self-pay | Admitting: Emergency Medicine

## 2017-09-01 ENCOUNTER — Ambulatory Visit (INDEPENDENT_AMBULATORY_CARE_PROVIDER_SITE_OTHER): Payer: BC Managed Care – PPO

## 2017-09-01 VITALS — BP 98/60 | HR 70 | Temp 98.2°F | Resp 16 | Ht 68.5 in | Wt 123.0 lb

## 2017-09-01 DIAGNOSIS — J22 Unspecified acute lower respiratory infection: Secondary | ICD-10-CM | POA: Diagnosis not present

## 2017-09-01 DIAGNOSIS — R059 Cough, unspecified: Secondary | ICD-10-CM | POA: Insufficient documentation

## 2017-09-01 DIAGNOSIS — R05 Cough: Secondary | ICD-10-CM

## 2017-09-01 DIAGNOSIS — R062 Wheezing: Secondary | ICD-10-CM | POA: Diagnosis not present

## 2017-09-01 MED ORDER — AZITHROMYCIN 250 MG PO TABS: ORAL_TABLET | ORAL | 0 refills | Status: DC

## 2017-09-01 MED ORDER — PREDNISONE 20 MG PO TABS: 20.0000 mg | ORAL_TABLET | Freq: Every day | ORAL | 0 refills | Status: AC

## 2017-09-01 NOTE — Progress Notes (Signed)
Amy Cohen 26 y.o.   Chief Complaint  Patient presents with  . Cough    NON PRODUCTIVE x 3 weeks with slight wheezing    HISTORY OF PRESENT ILLNESS: This is a 26 y.o. 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  HPI   Prior to Admission medications   Medication Sig Start Date End Date Taking? Authorizing Provider  ibuprofen (ADVIL,MOTRIN) 600 MG tablet Take 1 tablet (600 mg total) by mouth every 6 (six) hours. 12/06/15  Yes Cresenzo-Dishmon, Scarlette CalicoFrances, CNM  Multiple Vitamin (MULTIVITAMIN) tablet Take 1 tablet by mouth daily.   Yes [provider]  metroNIDAZOLE (FLAGYL) 500 MG tablet Take 1 tablet (500 mg total) by mouth 2 (two) times daily. Patient not taking: Reported on 09/01/2017 08/03/17   Hurshel PartyLeftwich-Kirby, Lisa A, CNM    No Known Allergies  Patient Active Problem List   Diagnosis Date Noted  . Encounter for Depo-Provera contraception 09/08/2016    No past medical history on file.  Past Surgical History:  Procedure Laterality Date  . WISDOM TOOTH EXTRACTION      Social History   Socioeconomic History  . Marital status: Single    Spouse name: Not on file  . Number of children: Not on file  . Years of education: Not on file  . Highest education level: Not on file  Social Needs  . Financial resource strain: Not on file  . Food insecurity - worry: Not on file  . Food insecurity - inability: Not on file  . Transportation needs - medical: Not on file  . Transportation needs - non-medical: Not on file  Occupational History  . Not on file  Tobacco Use  . Smoking status: Former Smoker    Packs/day: 0.25    Years: 3.00    Pack years: 0.75    Types: Cigarettes    Last attempt to quit: 04/11/2015    Years since quitting: 2.3  . Smokeless tobacco: Never Used  Substance and Sexual Activity  . Alcohol use: Yes    Alcohol/week: 13.8  oz    Types: 2 Cans of beer, 21 Shots of liquor per week  . Drug use: No  . Sexual activity: Yes    Birth control/protection: None  Other Topics Concern  . Not on file  Social History Narrative  . Not on file    Family History  Problem Relation Age of Onset  . Bursitis Mother   . Vitiligo Mother   . Autism Brother   . Heart disease Maternal Grandfather   . Hypertension Paternal Grandmother   . Cancer Maternal Aunt      Review of Systems  Constitutional: Negative.  Negative for chills and fever.  HENT: Negative.  Negative for congestion, nosebleeds and sore throat.   Eyes: Negative.  Negative for discharge and redness.  Respiratory: Positive for cough and wheezing. Negative for hemoptysis, sputum production and shortness of breath.   Cardiovascular: Negative for chest pain, palpitations and leg swelling.  Gastrointestinal: Negative.  Negative for abdominal pain, diarrhea, nausea and vomiting.  Genitourinary: Negative.  Negative for dysuria and hematuria.  Skin: Negative.  Negative for rash.  Neurological: Negative.  Negative for dizziness and headaches.  Endo/Heme/Allergies: Negative.   All other systems reviewed and are negative.  Vitals:   09/01/17 0856  BP: 98/60  Pulse: 70  Resp: 16  Temp: 98.2 F (36.8 C)  SpO2: 99%  Physical Exam  Constitutional: She is oriented to person, place, and time. She appears well-developed and well-nourished.  HENT:  Head: Normocephalic and atraumatic.  Mouth/Throat: Oropharynx is clear and moist.  Eyes: Conjunctivae and EOM are normal. Pupils are equal, round, and reactive to light.  Neck: Normal range of motion. Neck supple. No JVD present. No thyromegaly present.  Cardiovascular: Normal rate, regular rhythm, normal heart sounds and intact distal pulses.  Pulmonary/Chest: Effort normal and breath sounds normal.  Abdominal: Soft. Bowel sounds are normal. She exhibits no distension. There is no tenderness.  Musculoskeletal:  Normal range of motion.  Lymphadenopathy:    She has no cervical adenopathy.  Neurological: She is alert and oriented to person, place, and time. No sensory deficit. She exhibits normal muscle tone.  Skin: Skin is warm and dry. Capillary refill takes less than 2 seconds. No rash noted.  Psychiatric: She has a normal mood and affect. Her behavior is normal.  Vitals reviewed.   Dg Chest 2 View  Result Date: 09/01/2017 CLINICAL DATA:  Cough EXAM: CHEST  2 VIEW COMPARISON:  None FINDINGS: Normal heart size, mediastinal contours, and pulmonary vascularity. Lungs mildly hyperinflated but clear. No acute infiltrate, pleural effusion or pneumothorax. Bones unremarkable. IMPRESSION: Hyperinflation without acute infiltrate. Electronically Signed   By: Ulyses SouthwardMark  Boles M.D.   On: 09/01/2017 09:38     ASSESSMENT & PLAN: Amy Cohen was seen today for cough.  Diagnoses and all orders for this visit:  Cough -     DG Chest 2 View; Future  Lower resp. tract infection  Wheezing  Other orders -     azithromycin (ZITHROMAX) 250 MG tablet; Sig as indicated -     predniSONE (DELTASONE) 20 MG tablet; Take 1 tablet (20 mg total) by mouth daily with breakfast for 5 days.    Patient Instructions       IF you received an x-ray today, you will receive an invoice from [facility]. Please contact [facility] at [phone] with questions or concerns regarding your invoice.   IF you received labwork today, you will receive an invoice from [facility]. Please contact [facility] at [phone] with questions or concerns regarding your invoice.   Our billing staff will not be able to assist you with questions regarding bills from these companies.  You will be contacted with the lab results as soon as they are available. The fastest way to get your results is to activate your My Chart account. Instructions are located on the last page of this paperwork. If you have not heard from us regarding the  results in 2 weeks, please contact this office.     Acute Bronchitis, Adult Acute bronchitis is when air tubes (bronchi) in the lungs suddenly get swollen. The condition can make it hard to breathe. It can also cause these symptoms:  A cough.  Coughing up clear, yellow, or green mucus.  Wheezing.  Chest congestion.  Shortness of breath.  A fever.  Body aches.  Chills.  A sore throat.  Follow these instructions at home: Medicines  Take over-the-counter and prescription medicines only as told by your doctor.  If you were prescribed an antibiotic medicine, take it as told by your doctor. Do not stop taking the antibiotic even if you start to feel better. General instructions  Rest.  Drink enough fluids to keep your pee (urine) clear or pale yellow.  Avoid smoking and secondhand smoke. If you smoke and you need help quitting, ask your doctor. Quitting will help  your lungs heal faster.  Use an inhaler, cool mist vaporizer, or humidifier as told by your doctor.  Keep all follow-up visits as told by your doctor. This is important. How is this prevented? To lower your risk of getting this condition again:  Wash your hands often with soap and water. If you cannot use soap and water, use hand sanitizer.  Avoid contact with people who have cold symptoms.  Try not to touch your hands to your mouth, nose, or eyes.  Make sure to get the flu shot every year.  Contact a doctor if:  Your symptoms do not get better in 2 weeks. Get help right away if:  You cough up blood.  You have chest pain.  You have very bad shortness of breath.  You become dehydrated.  You faint (pass out) or keep feeling like you are going to pass out.  You keep throwing up (vomiting).  You have a very bad headache.  Your fever or chills gets worse. This information is not intended to replace advice given to you by your health care provider. Make sure you discuss any questions you have with  your health care provider. Document Released: 03/16/2008 Document Revised: 05/06/2016 Document Reviewed: 03/18/2016 Elsevier Interactive Patient Education  2017 Elsevier Inc.      Edwina Barth, MD [facility]

## 2017-09-01 NOTE — Patient Instructions (Addendum)
     IF you received an x-ray today, you will receive an invoice from [facility]. Please contact [facility] at [phone] with questions or concerns regarding your invoice.   IF you received labwork today, you will receive an invoice from [facility]. Please contact [facility] at [phone] with questions or concerns regarding your invoice.   Our billing staff will not be able to assist you with questions regarding bills from these companies.  You will be contacted with the lab results as soon as they are available. The fastest way to get your results is to activate your My Chart account. Instructions are located on the last page of this paperwork. If you have not heard from us regarding the results in 2 weeks, please contact this office.      Acute Bronchitis, Adult Acute bronchitis is when air tubes (bronchi) in the lungs suddenly get swollen. The condition can make it hard to breathe. It can also cause these symptoms:  A cough.  Coughing up clear, yellow, or green mucus.  Wheezing.  Chest congestion.  Shortness of breath.  A fever.  Body aches.  Chills.  A sore throat.  Follow these instructions at home: Medicines  Take over-the-counter and prescription medicines only as told by your doctor.  If you were prescribed an antibiotic medicine, take it as told by your doctor. Do not stop taking the antibiotic even if you start to feel better. General instructions  Rest.  Drink enough fluids to keep your pee (urine) clear or pale yellow.  Avoid smoking and secondhand smoke. If you smoke and you need help quitting, ask your doctor. Quitting will help your lungs heal faster.  Use an inhaler, cool mist vaporizer, or humidifier as told by your doctor.  Keep all follow-up visits as told by your doctor. This is important. How is this prevented? To lower your risk of getting this condition again:  Wash your hands often with soap and water. If you cannot  use soap and water, use hand sanitizer.  Avoid contact with people who have cold symptoms.  Try not to touch your hands to your mouth, nose, or eyes.  Make sure to get the flu shot every year.  Contact a doctor if:  Your symptoms do not get better in 2 weeks. Get help right away if:  You cough up blood.  You have chest pain.  You have very bad shortness of breath.  You become dehydrated.  You faint (pass out) or keep feeling like you are going to pass out.  You keep throwing up (vomiting).  You have a very bad headache.  Your fever or chills gets worse. This information is not intended to replace advice given to you by your health care provider. Make sure you discuss any questions you have with your health care provider. Document Released: 03/16/2008 Document Revised: 05/06/2016 Document Reviewed: 03/18/2016 Elsevier Interactive Patient Education  2017 Elsevier Inc.  

## 2017-10-19 ENCOUNTER — Ambulatory Visit (INDEPENDENT_AMBULATORY_CARE_PROVIDER_SITE_OTHER): Payer: BC Managed Care – PPO

## 2017-10-19 VITALS — BP 116/75 | HR 81 | Ht 69.0 in | Wt 121.1 lb

## 2017-10-19 DIAGNOSIS — Z3042 Encounter for surveillance of injectable contraceptive: Secondary | ICD-10-CM | POA: Diagnosis not present

## 2017-10-19 NOTE — Progress Notes (Signed)
Depoprovera given

## 2017-10-19 NOTE — Progress Notes (Signed)
Agree with nursing staff's documentation of this patient's clinic encounter.  Vonzella NippleJulie Lelani Garnett, PA-C 10/19/2017 2:25 PM

## 2018-01-04 ENCOUNTER — Ambulatory Visit (INDEPENDENT_AMBULATORY_CARE_PROVIDER_SITE_OTHER): Payer: BC Managed Care – PPO

## 2018-01-04 VITALS — BP 135/90 | HR 67 | Wt 122.7 lb

## 2018-01-04 DIAGNOSIS — Z3042 Encounter for surveillance of injectable contraceptive: Secondary | ICD-10-CM

## 2018-01-04 MED ORDER — MEDROXYPROGESTERONE ACETATE 150 MG/ML IM SUSP
150.0000 mg | Freq: Once | INTRAMUSCULAR | Status: AC
  Administered 2018-01-04: 150 mg via INTRAMUSCULAR

## 2018-01-15 NOTE — Progress Notes (Signed)
I was present at the time of this nurse visit.  Here for Depo.   Raynelle FanningJulie P. Degele, MD OB Fellow

## 2018-03-22 ENCOUNTER — Ambulatory Visit (INDEPENDENT_AMBULATORY_CARE_PROVIDER_SITE_OTHER): Payer: BC Managed Care – PPO

## 2018-03-22 VITALS — BP 111/73 | HR 105 | Wt 124.0 lb

## 2018-03-22 DIAGNOSIS — Z3042 Encounter for surveillance of injectable contraceptive: Secondary | ICD-10-CM | POA: Diagnosis not present

## 2018-03-22 NOTE — Progress Notes (Signed)
Date last pap: 07/13/2015. Last Depo-Provera: 01/04/2018. Side Effects if any: n/a. Serum HCG indicated? n/a. Depo-Provera 150 mg IM given by: Darrol AngelShannon Suzzette Gasparro, CMA(AAMA). Next appointment due August 27-Sept 10.

## 2018-06-08 ENCOUNTER — Ambulatory Visit: Payer: BC Managed Care – PPO

## 2019-12-16 ENCOUNTER — Ambulatory Visit: Payer: Medicaid Other | Attending: Internal Medicine

## 2019-12-16 DIAGNOSIS — Z23 Encounter for immunization: Secondary | ICD-10-CM | POA: Insufficient documentation

## 2019-12-16 NOTE — Progress Notes (Signed)
   Covid-19 Vaccination Clinic  Name:  Amy Cohen    MRN: [id number] DOB: [birth year]  12/16/2019  Amy Cohen was observed post Covid-19 immunization for 15 minutes without incident. She was provided with Vaccine Information Sheet and instruction to access the V-Safe system.   Amy Cohen was instructed to call 911 with any severe reactions post vaccine: Marland Kitchen Difficulty breathing  . Swelling of face and throat  . A fast heartbeat  . A bad rash all over body  . Dizziness and weakness   Immunizations Administered    Name Date Dose VIS Date Route   Pfizer COVID-19 Vaccine 12/16/2019 12:18 PM 0.3 mL 09/22/2019 Intramuscular   Manufacturer: [employer], [employer]   Lot: [id number]   NDC: [address]-6

## 2020-01-06 ENCOUNTER — Ambulatory Visit: Payer: Medicaid Other | Attending: Internal Medicine

## 2020-01-06 DIAGNOSIS — Z23 Encounter for immunization: Secondary | ICD-10-CM

## 2020-01-06 NOTE — Progress Notes (Signed)
   Covid-19 Vaccination Clinic  Name:  Amy Cohen    MRN: [id number] DOB: Feb 25, 1991  01/06/2020  [name] was observed post Covid-19 immunization for 15 minutes without incident. She was provided with Vaccine Information Sheet and instruction to access the V-Safe system.   [name] was instructed to call 911 with any severe reactions post vaccine: Marland Kitchen Difficulty breathing  . Swelling of face and throat  . A fast heartbeat  . A bad rash all over body  . Dizziness and weakness   Immunizations Administered    Name Date Dose VIS Date Route   Pfizer COVID-19 Vaccine 01/06/2020 12:35 PM 0.3 mL 09/22/2019 Intramuscular   Manufacturer: [employer], [employer]   Lot: [id number]   NDC: [address]-8

## 2021-02-12 ENCOUNTER — Other Ambulatory Visit: Payer: Self-pay

## 2021-02-12 ENCOUNTER — Encounter (HOSPITAL_COMMUNITY): Payer: Self-pay | Admitting: Emergency Medicine

## 2021-02-12 ENCOUNTER — Emergency Department (HOSPITAL_COMMUNITY): Payer: BC Managed Care – PPO

## 2021-02-12 ENCOUNTER — Inpatient Hospital Stay (HOSPITAL_COMMUNITY)
Admission: EM | Admit: 2021-02-12 | Discharge: 2021-02-15 | DRG: 641 | Disposition: A | Discharge status: Home / Self Care | Payer: BC Managed Care – PPO | Attending: Family Medicine | Admitting: Family Medicine

## 2021-02-12 DIAGNOSIS — F1010 Alcohol abuse, uncomplicated: Secondary | ICD-10-CM

## 2021-02-12 DIAGNOSIS — F17210 Nicotine dependence, cigarettes, uncomplicated: Secondary | ICD-10-CM | POA: Diagnosis present

## 2021-02-12 DIAGNOSIS — R636 Underweight: Secondary | ICD-10-CM

## 2021-02-12 DIAGNOSIS — Z20822 Contact with and (suspected) exposure to covid-19: Secondary | ICD-10-CM | POA: Diagnosis present

## 2021-02-12 DIAGNOSIS — R4182 Altered mental status, unspecified: Secondary | ICD-10-CM | POA: Diagnosis not present

## 2021-02-12 DIAGNOSIS — R9431 Abnormal electrocardiogram [ECG] [EKG]: Secondary | ICD-10-CM

## 2021-02-12 DIAGNOSIS — A419 Sepsis, unspecified organism: Secondary | ICD-10-CM

## 2021-02-12 DIAGNOSIS — E162 Hypoglycemia, unspecified: Secondary | ICD-10-CM | POA: Diagnosis not present

## 2021-02-12 DIAGNOSIS — E871 Hypo-osmolality and hyponatremia: Secondary | ICD-10-CM | POA: Diagnosis present

## 2021-02-12 DIAGNOSIS — G4089 Other seizures: Secondary | ICD-10-CM | POA: Diagnosis present

## 2021-02-12 DIAGNOSIS — F10230 Alcohol dependence with withdrawal, uncomplicated: Secondary | ICD-10-CM

## 2021-02-12 DIAGNOSIS — D72828 Other elevated white blood cell count: Secondary | ICD-10-CM | POA: Diagnosis present

## 2021-02-12 DIAGNOSIS — F10930 Alcohol use, unspecified with withdrawal, uncomplicated: Secondary | ICD-10-CM

## 2021-02-12 DIAGNOSIS — R32 Unspecified urinary incontinence: Secondary | ICD-10-CM | POA: Diagnosis present

## 2021-02-12 DIAGNOSIS — R748 Abnormal levels of other serum enzymes: Secondary | ICD-10-CM

## 2021-02-12 LAB — CBC WITH DIFFERENTIAL/PLATELET
Abs Immature Granulocytes: 0.25 10*3/uL — ABNORMAL HIGH (ref 0.00–0.07)
Basophils Absolute: 0.1 10*3/uL (ref 0.0–0.1)
Basophils Relative: 0 %
Eosinophils Absolute: 0 10*3/uL (ref 0.0–0.5)
Eosinophils Relative: 0 %
HCT: 43.2 % (ref 36.0–46.0)
Hemoglobin: 12.9 g/dL (ref 12.0–15.0)
Immature Granulocytes: 1 %
Lymphocytes Relative: 2 %
Lymphs Abs: 0.5 10*3/uL — ABNORMAL LOW (ref 0.7–4.0)
MCH: 26 pg (ref 26.0–34.0)
MCHC: 29.9 g/dL — ABNORMAL LOW (ref 30.0–36.0)
MCV: 86.9 fL (ref 80.0–100.0)
Monocytes Absolute: 1.2 10*3/uL — ABNORMAL HIGH (ref 0.1–1.0)
Monocytes Relative: 4 %
Neutro Abs: 31.3 10*3/uL — ABNORMAL HIGH (ref 1.7–7.7)
Neutrophils Relative %: 93 %
Platelets: 231 10*3/uL (ref 150–400)
RBC: 4.97 MIL/uL (ref 3.87–5.11)
RDW: 14.6 % (ref 11.5–15.5)
WBC: 33.3 10*3/uL — ABNORMAL HIGH (ref 4.0–10.5)
nRBC: 0 % (ref 0.0–0.2)

## 2021-02-12 LAB — ETHANOL: Alcohol, Ethyl (B): <10 mg/dL (ref <10)

## 2021-02-12 LAB — URINALYSIS, ROUTINE W REFLEX MICROSCOPIC
Bilirubin Urine: NEGATIVE
Glucose, UA: >=500 mg/dL — AB
Ketones, ur: 80 mg/dL — AB
Leukocytes,Ua: NEGATIVE
Nitrite: NEGATIVE
Protein, ur: 30 mg/dL — AB
Specific Gravity, Urine: 1.009 (ref 1.005–1.030)
pH: 5 (ref 5.0–8.0)

## 2021-02-12 LAB — COMPREHENSIVE METABOLIC PANEL
ALT: 129 U/L — ABNORMAL HIGH (ref 0–44)
AST: 454 U/L — ABNORMAL HIGH (ref 15–41)
Albumin: 4.9 g/dL (ref 3.5–5.0)
Alkaline Phosphatase: 76 U/L (ref 38–126)
Anion gap: 19 — ABNORMAL HIGH (ref 5–15)
BUN: 10 mg/dL (ref 6–20)
CO2: 10 mmol/L — ABNORMAL LOW (ref 22–32)
Calcium: 8.4 mg/dL — ABNORMAL LOW (ref 8.9–10.3)
Chloride: 99 mmol/L (ref 98–111)
Creatinine, Ser: 0.94 mg/dL (ref 0.44–1.00)
GFR, Estimated: >60 mL/min (ref >60)
Glucose, Bld: 299 mg/dL — ABNORMAL HIGH (ref 70–99)
Potassium: 5.1 mmol/L (ref 3.5–5.1)
Sodium: 128 mmol/L — ABNORMAL LOW (ref 135–145)
Total Bilirubin: 1.3 mg/dL — ABNORMAL HIGH (ref 0.3–1.2)
Total Protein: 9.1 g/dL — ABNORMAL HIGH (ref 6.5–8.1)

## 2021-02-12 LAB — MAGNESIUM: Magnesium: 1.3 mg/dL — ABNORMAL LOW (ref 1.7–2.4)

## 2021-02-12 LAB — CBG MONITORING, ED
Glucose-Capillary: 295 mg/dL — ABNORMAL HIGH (ref 70–99)
Glucose-Capillary: 336 mg/dL — ABNORMAL HIGH (ref 70–99)

## 2021-02-12 LAB — RAPID URINE DRUG SCREEN, HOSP PERFORMED
Amphetamines: NOT DETECTED
Barbiturates: NOT DETECTED
Benzodiazepines: NOT DETECTED
Cocaine: NOT DETECTED
Opiates: NOT DETECTED
Tetrahydrocannabinol: NOT DETECTED

## 2021-02-12 LAB — CK: Total CK: 500 U/L — ABNORMAL HIGH (ref 38–234)

## 2021-02-12 LAB — LACTIC ACID, PLASMA: Lactic Acid, Venous: 5.7 mmol/L (ref 0.5–1.9)

## 2021-02-12 LAB — HCG, QUANTITATIVE, PREGNANCY: hCG, Beta Chain, Quant, S: <1 m[IU]/mL (ref <5)

## 2021-02-12 LAB — VITAMIN B12: Vitamin B-12: 405 pg/mL (ref 180–914)

## 2021-02-12 MED ORDER — VANCOMYCIN HCL IN DEXTROSE 1-5 GM/200ML-% IV SOLN
1000.0000 mg | Freq: Once | INTRAVENOUS | Status: AC
  Administered 2021-02-13: 1000 mg via INTRAVENOUS
  Filled 2021-02-12: qty 200

## 2021-02-12 MED ORDER — SODIUM CHLORIDE 0.9 % IV BOLUS
1000.0000 mL | Freq: Once | INTRAVENOUS | Status: AC
  Administered 2021-02-13: 1000 mL via INTRAVENOUS

## 2021-02-12 MED ORDER — SODIUM CHLORIDE 0.9 % IV BOLUS
500.0000 mL | Freq: Once | INTRAVENOUS | Status: AC
  Administered 2021-02-12: 500 mL via INTRAVENOUS

## 2021-02-12 MED ORDER — METRONIDAZOLE 500 MG/100ML IV SOLN
500.0000 mg | Freq: Once | INTRAVENOUS | Status: AC
  Administered 2021-02-12: 500 mg via INTRAVENOUS
  Filled 2021-02-12: qty 100

## 2021-02-12 MED ORDER — LORAZEPAM 2 MG/ML IJ SOLN
1.0000 mg | Freq: Once | INTRAMUSCULAR | Status: AC
  Administered 2021-02-12: 1 mg via INTRAVENOUS
  Filled 2021-02-12: qty 1

## 2021-02-12 MED ORDER — SODIUM CHLORIDE 0.9 % IV SOLN
2.0000 g | Freq: Once | INTRAVENOUS | Status: AC
  Administered 2021-02-12: 2 g via INTRAVENOUS
  Filled 2021-02-12: qty 2

## 2021-02-12 MED ORDER — MAGNESIUM SULFATE 2 GM/50ML IV SOLN
2.0000 g | Freq: Once | INTRAVENOUS | Status: AC
  Administered 2021-02-13: 2 g via INTRAVENOUS
  Filled 2021-02-12: qty 50

## 2021-02-12 NOTE — ED Notes (Signed)
Lab called to let nurse know blood samples were unusable. Nurse put in for IV team consult for blood draw and asked tech to attempt the blood draw as well.

## 2021-02-12 NOTE — ED Provider Notes (Signed)
[facility]-EMERGENCY DEPT Provider Note   CSN: [id number] Arrival date & time: 02/12/21  1812     History Chief Complaint  Patient presents with  . Hypoglycemia    Amy Cohen is a 30 y.o. female.  Patient is a 30 year old female who presents after an episode of hypoglycemia.  She reports that earlier today she was feeling generally weak and tired.  She had some lightheadedness.  She was walking in her house and she said she tripped and fell.  When she was on the ground, she said she was profoundly weak and was not able to get up off the ground.  She laid on the ground for about 2 hours prior to her significant other finding her.  She was incontinent.  She is not sure if she was incontinent because she could not get up and go to the bathroom or because she was not aware that she had to urinate.  She complains of some low back pain and a headache although she denies any injuries from the fall.  She said that she fell on some close and did not hit her head.  She feels weak all over and shaky.  She is a known alcoholic.  She drinks daily.  Her last intake was last night.  She feels a little tremulous.  She denies any drug use.  She says she does not eat very much.  She has not really eaten a meal since Monday.  EMS found her blood sugar to be 26.  She was given IV glucose and something to eat and it improved.  She denies any known fevers.  No vomiting or diarrhea.  No cough or cold symptoms.  She has had a little bit of congestion which she attributes to allergies.        History reviewed. No pertinent past medical history.  Patient Active Problem List   Diagnosis Date Noted  . Cough 09/01/2017  . Lower resp. tract infection 09/01/2017  . Wheezing 09/01/2017  . Encounter for Depo-Provera contraception 09/08/2016    Past Surgical History:  Procedure Laterality Date  . WISDOM TOOTH EXTRACTION       OB History    Gravida  1   Para  1   Term  1   Preterm   0   AB  0   Living  1     SAB  0   IAB  0   Ectopic  0   Multiple  0   Live Births  1           Family History  Problem Relation Age of Onset  . Bursitis Mother   . Vitiligo Mother   . Autism Brother   . Heart disease Maternal Grandfather   . Hypertension Paternal Grandmother   . Cancer Maternal Aunt     Social History   Tobacco Use  . Smoking status: Former Smoker    Packs/day: 0.25    Years: 3.00    Pack years: 0.75    Types: Cigarettes    Quit date: 04/11/2015    Years since quitting: 5.8  . Smokeless tobacco: Never Used  Substance Use Topics  . Alcohol use: Yes    Alcohol/week: 23.0 standard drinks    Types: 2 Cans of beer, 21 Shots of liquor per week  . Drug use: No    Home Medications Prior to Admission medications   Medication Sig Start Date End Date Taking? Authorizing Provider  fluticasone Aleda Grana)  50 MCG/ACT nasal spray Place 1 spray into both nostrils daily as needed for allergies or rhinitis.   Yes [provider]  ibuprofen (ADVIL) 200 MG tablet Take 600 mg by mouth every 6 (six) hours as needed for headache.   Yes [provider]  Polyvinyl Alcohol-Povidone (CLEAR EYES ALL SEASONS OP) Place 1 drop into both eyes daily as needed (irritation).   Yes [provider]  ibuprofen (ADVIL,MOTRIN) 600 MG tablet Take 1 tablet (600 mg total) by mouth every 6 (six) hours. Patient not taking: No sig reported 12/06/15   Jacklyn Shell, CNM    Allergies    Patient has no known allergies.  Review of Systems   Review of Systems  Constitutional: Positive for fatigue. Negative for chills, diaphoresis and fever.  HENT: Positive for congestion and rhinorrhea. Negative for sneezing.   Eyes: Negative.   Respiratory: Negative for cough, chest tightness and shortness of breath.   Cardiovascular: Negative for chest pain and leg swelling.  Gastrointestinal: Negative for abdominal pain, blood in stool, diarrhea, nausea and  vomiting.  Genitourinary: Negative for difficulty urinating, flank pain, frequency and hematuria.  Musculoskeletal: Positive for back pain. Negative for arthralgias.  Skin: Negative for rash.  Neurological: Positive for weakness (Generalized), light-headedness and headaches. Negative for dizziness, speech difficulty and numbness.    Physical Exam Updated Vital Signs BP 116/81   Pulse (!) 107   Temp 98.2 F (36.8 C) (Rectal)   Resp 18   LMP 01/29/2021 (Approximate)   SpO2 100%   Physical Exam Constitutional:      Appearance: She is well-developed.  HENT:     Head: Normocephalic and atraumatic.  Eyes:     Pupils: Pupils are equal, round, and reactive to light.  Cardiovascular:     Rate and Rhythm: Normal rate and regular rhythm.     Heart sounds: Normal heart sounds.  Pulmonary:     Effort: Pulmonary effort is normal. No respiratory distress.     Breath sounds: Normal breath sounds. No wheezing or rales.  Chest:     Chest wall: No tenderness.  Abdominal:     General: Bowel sounds are normal.     Palpations: Abdomen is soft.     Tenderness: There is no abdominal tenderness. There is no guarding or rebound.  Musculoskeletal:        General: Normal range of motion.     Cervical back: Normal range of motion and neck supple.     Comments: Positive tenderness in the musculature of the lower back but no spinal tenderness.  Lymphadenopathy:     Cervical: No cervical adenopathy.  Skin:    General: Skin is warm and dry.     Findings: No rash.  Neurological:     General: No focal deficit present.     Mental Status: She is alert and oriented to person, place, and time.     Comments: Slight tremor     ED Results / Procedures / Treatments   Labs (all labs ordered are listed, but only abnormal results are displayed) Labs Reviewed  COMPREHENSIVE METABOLIC PANEL - Abnormal; Notable for the following components:      Result Value   Sodium 128 (*)    CO2 10 (*)    Glucose, Bld  299 (*)    Calcium 8.4 (*)    Total Protein 9.1 (*)    AST 454 (*)    ALT 129 (*)    Total Bilirubin 1.3 (*)  Anion gap 19 (*)    All other components within normal limits  CBC WITH DIFFERENTIAL/PLATELET - Abnormal; Notable for the following components:   WBC 33.3 (*)    MCHC 29.9 (*)    Neutro Abs 31.3 (*)    Lymphs Abs 0.5 (*)    Monocytes Absolute 1.2 (*)    Abs Immature Granulocytes 0.25 (*)    All other components within normal limits  URINALYSIS, ROUTINE W REFLEX MICROSCOPIC - Abnormal; Notable for the following components:   Glucose, UA >=500 (*)    Hgb urine dipstick LARGE (*)    Ketones, ur 80 (*)    Protein, ur 30 (*)    Bacteria, UA RARE (*)    All other components within normal limits  CK - Abnormal; Notable for the following components:   Total CK 500 (*)    All other components within normal limits  LACTIC ACID, PLASMA - Abnormal; Notable for the following components:   Lactic Acid, Venous 5.7 (*)    All other components within normal limits  MAGNESIUM - Abnormal; Notable for the following components:   Magnesium 1.3 (*)    All other components within normal limits  CBG MONITORING, ED - Abnormal; Notable for the following components:   Glucose-Capillary 295 (*)    All other components within normal limits  CBG MONITORING, ED - Abnormal; Notable for the following components:   Glucose-Capillary 336 (*)    All other components within normal limits  CULTURE, BLOOD (ROUTINE X 2)  CULTURE, BLOOD (ROUTINE X 2)  SARS CORONAVIRUS 2 (TAT 6-24 HRS)  ETHANOL  RAPID URINE DRUG SCREEN, HOSP PERFORMED  HCG, QUANTITATIVE, PREGNANCY  LACTIC ACID, PLASMA  VITAMIN B12    EKG EKG Interpretation  Date/Time:  Wednesday Feb 12 2021 18:29:54 EDT Ventricular Rate:  116 PR Interval:  112 QRS Duration: 88 QT Interval:  394 QTC Calculation: 547 R Axis:   67 Text Interpretation: Sinus tachycardia Right atrial enlargement Nonspecific ST abnormality Prolonged QT Abnormal  ECG Confirmed by Rolan Bucco [phone]) on 02/12/2021 7:17:52 PM   Radiology DG Chest 2 View  Result Date: 02/12/2021 CLINICAL DATA:  Weakness, fell, low back pain EXAM: CHEST - 2 VIEW COMPARISON:  09/01/2017 FINDINGS: The heart size and mediastinal contours are within normal limits. Both lungs are clear. The visualized skeletal structures are unremarkable. IMPRESSION: No active cardiopulmonary disease. Electronically Signed   By: Sharlet Salina M.D.   On: 02/12/2021 22:44   DG Lumbar Spine Complete  Result Date: 02/12/2021 CLINICAL DATA:  Larey Seat, back pain and bruising EXAM: LUMBAR SPINE - COMPLETE 4+ VIEW COMPARISON:  None. FINDINGS: Frontal, bilateral oblique, and lateral views of the lumbar spine are obtained. There are 6 non-rib-bearing lumbar type vertebral bodies in normal alignment. No fractures. Disc spaces are well preserved. Sacroiliac joints are normal. IMPRESSION: 1. 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 2. No acute bony abnormality. Electronically Signed   By: Sharlet Salina M.D.   On: 02/12/2021 22:45   CT Head Wo Contrast  Result Date: 02/12/2021 CLINICAL DATA:  Headache and syncopal episode. EXAM: CT HEAD WITHOUT CONTRAST TECHNIQUE: Contiguous axial images were obtained from the base of the skull through the vertex without intravenous contrast. COMPARISON:  None. FINDINGS: Brain: No evidence of acute infarction, hemorrhage, hydrocephalus, extra-axial collection or mass lesion/mass effect. Vascular: No hyperdense vessel or unexpected calcification. Skull: Normal. Negative for fracture or focal lesion. Sinuses/Orbits: No acute finding. Other: None. IMPRESSION: No acute intracranial abnormality.  Electronically Signed   By: Aram Candelahaddeus  Houston M.D.   On: 02/12/2021 21:15    Procedures Procedures   Medications Ordered in ED Medications  LORazepam (ATIVAN) injection 1 mg (has no administration in time range)  sodium chloride 0.9 % bolus 500 mL (has  no administration in time range)  ceFEPIme (MAXIPIME) 2 g in sodium chloride 0.9 % 100 mL IVPB (has no administration in time range)  metroNIDAZOLE (FLAGYL) IVPB 500 mg (has no administration in time range)  vancomycin (VANCOCIN) IVPB 1000 mg/200 mL premix (has no administration in time range)  sodium chloride 0.9 % bolus 500 mL (500 mLs Intravenous New Bag/Given 02/12/21 1941)  LORazepam (ATIVAN) injection 1 mg (1 mg Intravenous Given 02/12/21 1939)    ED Course  I have reviewed the triage vital signs and the nursing notes.  Pertinent labs & imaging results that were available during my care of the patient were reviewed by me and considered in my medical decision making (see chart for details).    MDM Rules/Calculators/A&P                          Patient is a 30 year old female who presents after an episode where she fell on the floor.  She was unable to get up due to generalized weakness.  She claims that she was awake the entire time although she was incontinent.  Potentially she had a seizure.  She was found to be markedly hypoglycemic by EMS and was treated for same.  Her glucose is now elevated.  She is acidotic.  She has an elevated WBC count.  I do not see any obvious source of infection.  Her urinalysis does not appear consistent with infection.  She has no evidence of pneumonia.  She does not have abdominal tenderness on exam.  She does not have any meningismus or other suggestions of meningitis.  Her COVID test is pending.  She is having some withdrawal symptoms with a tremor.  She is tachycardic which may be from withdrawal versus sepsis.  After a prolonged delay, her lactate did come back elevated at 5.7.  Given this in association with a WBC count, I will go ahead and treat her as potential sepsis although these findings could also be explained by other etiologies such as a seizure.  She does not have any focal neurologic deficits.  She does not have any ongoing seizure activity.  She  does have elevation in her LFTs.  This is elevated as compared to her prior values 4 years ago.  She does not report any significant Tylenol ingestions.  It is likely related to either acute infection versus her alcohol ingestion.  She was treated for potential sepsis with IV antibiotics and IV fluids.  She was given Ativan for her withdrawal symptoms.  I spoke with Dr. Rachael Darbyhotiner who will admit the pt for further treatment.  CRITICAL CARE Performed by: Rolan BuccoMelanie Karli Wickizer Total critical care time: 90 minutes Critical care time was exclusive of separately billable procedures and treating other patients. Critical care was necessary to treat or prevent imminent or life-threatening deterioration. Critical care was time spent personally by me on the following activities: development of treatment plan with patient and/or surrogate as well as nursing, discussions with consultants, evaluation of patient's response to treatment, examination of patient, obtaining history from patient or surrogate, ordering and performing treatments and interventions, ordering and review of laboratory studies, ordering and review of radiographic studies, pulse oximetry and  re-evaluation of patient's condition.  Final Clinical Impression(s) / ED Diagnoses Final diagnoses:  Hypoglycemia  Alcohol withdrawal syndrome without complication (HCC)  Elevated liver enzymes  Sepsis, due to unspecified organism, unspecified whether acute organ dysfunction present [facility])    Rx / DC Orders ED Discharge Orders    None       Rolan Bucco, MD 02/12/21 2339

## 2021-02-12 NOTE — Sepsis Progress Note (Signed)
Monitoring for code sepsis protocol. 

## 2021-02-12 NOTE — Progress Notes (Signed)
A consult was received from an ED physician for vancomycin and cefepime per pharmacy dosing.  The patient's profile has been reviewed for ht/wt/allergies/indication/available labs.   A one time order has been placed for vancomycin 1gm and cefepime 2gm    Further antibiotics/pharmacy consults should be ordered by admitting physician if indicated.                       Thank you, Arley Phenix RPh 02/12/2021, 11:45 PM

## 2021-02-12 NOTE — ED Provider Notes (Signed)
Emergency Medicine Provider Triage Evaluation Note  Amy Cohen , a 30 y.o. female  was evaluated in triage.  Pt complains of hypoglycemia.  Significant other found patient at home minimally responsive, she was able to following with her eyes but could not talk.  EMS called and patient found to have glucose of 26, they gave her something through the IV and gave her some glucose gels to eat, but they declined transport at that time.  He tried to get her shower give her a peanut butter sandwich and some Gatorade.  She was not getting better so he brought her in.  Patient very tremulous.  No prior history of hypoglycemia or history of diabetes.  Reports she has not really had anything to eat since Monday prior to having the sandwich and Gatorade, but reports she is a heavy drinker and typically drinks vodka or tequila.  Last drink last night.  Review of Systems  Positive: Tremors, fatigue, body aches Negative: Vomiting, chest pain abdominal pain, fever  Physical Exam  BP (!) 155/93 (BP Location: Left Arm)   Pulse (!) 119   Temp (!) 97.5 F (36.4 C) (Oral)   Resp (!) 22   SpO2 100%  Gen:   Awake, tremulous and ill-appearing Resp:  Normal effort  MSK:   Moves extremities without difficulty    Medical Decision Making  Medically screening exam initiated at 6:20 PM.  Appropriate orders placed.  Amy Cohen was informed that the remainder of the evaluation will be completed by another provider, this initial triage assessment does not replace that evaluation, and the importance of remaining in the ED until their evaluation is complete.  Blood sugar here is 295, but patient is very ill-appearing without prior history of hypoglycemia, will need acute bed for further evaluation.   Dartha Lodge, PA-C 02/12/21 1832    Terald Sleeper, MD 02/13/21 1155

## 2021-02-12 NOTE — ED Triage Notes (Addendum)
Pt found to be laying down on the floor by significant other. EMS called, EMS reports that pt CBG was 26. Dextrose given to patient, along with Gatorade, pt ate sandwich. Pt visibly shaking, hand tremors. Pt alert and verbal. Pt states she is not diabetic. Pt states she is a heavy drinker, has not had much to eat before today.

## 2021-02-13 ENCOUNTER — Inpatient Hospital Stay (HOSPITAL_COMMUNITY): Payer: BC Managed Care – PPO

## 2021-02-13 ENCOUNTER — Encounter (HOSPITAL_COMMUNITY): Payer: Self-pay | Admitting: Family Medicine

## 2021-02-13 DIAGNOSIS — Z20822 Contact with and (suspected) exposure to covid-19: Secondary | ICD-10-CM | POA: Diagnosis present

## 2021-02-13 DIAGNOSIS — R4182 Altered mental status, unspecified: Secondary | ICD-10-CM | POA: Diagnosis present

## 2021-02-13 DIAGNOSIS — R636 Underweight: Secondary | ICD-10-CM

## 2021-02-13 DIAGNOSIS — D72828 Other elevated white blood cell count: Secondary | ICD-10-CM | POA: Diagnosis present

## 2021-02-13 DIAGNOSIS — E871 Hypo-osmolality and hyponatremia: Secondary | ICD-10-CM | POA: Diagnosis present

## 2021-02-13 DIAGNOSIS — F17210 Nicotine dependence, cigarettes, uncomplicated: Secondary | ICD-10-CM | POA: Diagnosis present

## 2021-02-13 DIAGNOSIS — R32 Unspecified urinary incontinence: Secondary | ICD-10-CM | POA: Diagnosis present

## 2021-02-13 DIAGNOSIS — R9431 Abnormal electrocardiogram [ECG] [EKG]: Secondary | ICD-10-CM

## 2021-02-13 DIAGNOSIS — E162 Hypoglycemia, unspecified: Secondary | ICD-10-CM

## 2021-02-13 DIAGNOSIS — G4089 Other seizures: Secondary | ICD-10-CM | POA: Diagnosis present

## 2021-02-13 DIAGNOSIS — R748 Abnormal levels of other serum enzymes: Secondary | ICD-10-CM

## 2021-02-13 DIAGNOSIS — F1010 Alcohol abuse, uncomplicated: Secondary | ICD-10-CM

## 2021-02-13 LAB — GLUCOSE, CAPILLARY
Glucose-Capillary: 155 mg/dL — ABNORMAL HIGH (ref 70–99)
Glucose-Capillary: 123 mg/dL — ABNORMAL HIGH (ref 70–99)

## 2021-02-13 LAB — SARS CORONAVIRUS 2 (TAT 6-24 HRS): SARS Coronavirus 2: NEGATIVE

## 2021-02-13 LAB — CBC
HCT: 37.4 % (ref 36.0–46.0)
Hemoglobin: 12 g/dL (ref 12.0–15.0)
MCH: 26.6 pg (ref 26.0–34.0)
MCHC: 32.1 g/dL (ref 30.0–36.0)
MCV: 82.9 fL (ref 80.0–100.0)
Platelets: 176 10*3/uL (ref 150–400)
RBC: 4.51 MIL/uL (ref 3.87–5.11)
RDW: 13.9 % (ref 11.5–15.5)
WBC: 24.2 10*3/uL — ABNORMAL HIGH (ref 4.0–10.5)
nRBC: 0 % (ref 0.0–0.2)

## 2021-02-13 LAB — COMPREHENSIVE METABOLIC PANEL
ALT: 81 U/L — ABNORMAL HIGH (ref 0–44)
AST: 188 U/L — ABNORMAL HIGH (ref 15–41)
Albumin: 3.5 g/dL (ref 3.5–5.0)
Alkaline Phosphatase: 45 U/L (ref 38–126)
Anion gap: 9 (ref 5–15)
BUN: 8 mg/dL (ref 6–20)
CO2: 17 mmol/L — ABNORMAL LOW (ref 22–32)
Calcium: 7.5 mg/dL — ABNORMAL LOW (ref 8.9–10.3)
Chloride: 117 mmol/L — ABNORMAL HIGH (ref 98–111)
Creatinine, Ser: 0.39 mg/dL — ABNORMAL LOW (ref 0.44–1.00)
GFR, Estimated: >60 mL/min (ref >60)
Glucose, Bld: 103 mg/dL — ABNORMAL HIGH (ref 70–99)
Potassium: 4.5 mmol/L (ref 3.5–5.1)
Sodium: 143 mmol/L (ref 135–145)
Total Bilirubin: 0.7 mg/dL (ref 0.3–1.2)
Total Protein: 6.5 g/dL (ref 6.5–8.1)

## 2021-02-13 LAB — LACTIC ACID, PLASMA
Lactic Acid, Venous: 1.4 mmol/L (ref 0.5–1.9)
Lactic Acid, Venous: 3.9 mmol/L (ref 0.5–1.9)

## 2021-02-13 LAB — CBG MONITORING, ED
Glucose-Capillary: 91 mg/dL (ref 70–99)
Glucose-Capillary: 81 mg/dL (ref 70–99)

## 2021-02-13 LAB — PHOSPHORUS: Phosphorus: 1.4 mg/dL — ABNORMAL LOW (ref 2.5–4.6)

## 2021-02-13 LAB — HIV ANTIBODY (ROUTINE TESTING W REFLEX): HIV Screen 4th Generation wRfx: NONREACTIVE

## 2021-02-13 LAB — MAGNESIUM: Magnesium: 2 mg/dL (ref 1.7–2.4)

## 2021-02-13 MED ORDER — LORAZEPAM 1 MG PO TABS: 1.0000 mg | ORAL_TABLET | ORAL | Status: DC | PRN

## 2021-02-13 MED ORDER — ENOXAPARIN SODIUM 40 MG/0.4ML IJ SOSY
40.0000 mg | PREFILLED_SYRINGE | INTRAMUSCULAR | Status: DC
  Administered 2021-02-13 – 2021-02-15 (×3): 40 mg via SUBCUTANEOUS
  Filled 2021-02-13 (×3): qty 0.4

## 2021-02-13 MED ORDER — FOLIC ACID 1 MG PO TABS
1.0000 mg | ORAL_TABLET | Freq: Every day | ORAL | Status: DC
  Administered 2021-02-13 – 2021-02-15 (×3): 1 mg via ORAL
  Filled 2021-02-13 (×3): qty 1

## 2021-02-13 MED ORDER — THIAMINE HCL 100 MG/ML IJ SOLN: 100.0000 mg | Freq: Every day | INTRAMUSCULAR | Status: DC

## 2021-02-13 MED ORDER — POTASSIUM PHOSPHATES 15 MMOLE/5ML IV SOLN
30.0000 mmol | Freq: Once | INTRAVENOUS | Status: AC
  Administered 2021-02-13: 30 mmol via INTRAVENOUS
  Filled 2021-02-13: qty 10

## 2021-02-13 MED ORDER — LACTATED RINGERS IV SOLN: INTRAVENOUS | Status: DC

## 2021-02-13 MED ORDER — LORAZEPAM 2 MG/ML IJ SOLN
0.0000 mg | Freq: Four times a day (QID) | INTRAMUSCULAR | Status: AC
  Administered 2021-02-13: 1 mg via INTRAVENOUS
  Filled 2021-02-13 (×2): qty 1

## 2021-02-13 MED ORDER — THIAMINE HCL 100 MG PO TABS
100.0000 mg | ORAL_TABLET | Freq: Every day | ORAL | Status: DC
  Administered 2021-02-13 – 2021-02-15 (×3): 100 mg via ORAL
  Filled 2021-02-13 (×3): qty 1

## 2021-02-13 MED ORDER — SENNOSIDES-DOCUSATE SODIUM 8.6-50 MG PO TABS
1.0000 | ORAL_TABLET | Freq: Every evening | ORAL | Status: DC | PRN
  Filled 2021-02-13: qty 1

## 2021-02-13 MED ORDER — LORAZEPAM 2 MG/ML IJ SOLN: 0.0000 mg | Freq: Two times a day (BID) | INTRAMUSCULAR | Status: DC

## 2021-02-13 MED ORDER — LORAZEPAM 2 MG/ML IJ SOLN: 1.0000 mg | INTRAMUSCULAR | Status: DC | PRN

## 2021-02-13 MED ORDER — ADULT MULTIVITAMIN W/MINERALS CH
1.0000 | ORAL_TABLET | Freq: Every day | ORAL | Status: DC
  Administered 2021-02-13 – 2021-02-15 (×3): 1 via ORAL
  Filled 2021-02-13 (×3): qty 1

## 2021-02-13 NOTE — Sepsis Progress Note (Signed)
Notified provider of need to order repeat lactic acid. ° °

## 2021-02-13 NOTE — Sepsis Progress Note (Signed)
Notified bedside nurse of need to draw repeat lactic acid. 

## 2021-02-13 NOTE — ED Notes (Signed)
Pt. Documented in error see above note in chart. 

## 2021-02-13 NOTE — TOC Initial Note (Signed)
Transition of Care [facility]) - Initial/Assessment Note    Patient Details  Name: Amy Cohen MRN: [id number] Date of Birth: 24-Mar-1991  [facility] of [facility]) CM/SW Contact:    Ida Rogue, LCSW Phone Number: 02/13/2021, 11:20 AM  Clinical Narrative:    Ms Amy Cohen was seen in follow up to MD consult for substance abuse resources.  I found her to be engaged and open to discussion of alcohol use and a wish to reduce consumption.  Her significant other was in the room with her, and she included him in the discussion.  Ms Amy Cohen states she thinks her drinking contributed to her need for hospitalization, and as such is worrisome to her.  She has not attempted to stop or cut back before, but is currently motivated to do so.  She is aware that it will not be easy to do, and is wondering aobut resources to help her.  I talked to her about intensive outpatient programs, how they operate and how they might be helpful.  She was accepting of this information and asked that I give her contact information.  Information was added to her AVS for follow up post hospitalization. TOC will continue to follow during the course of hospitalization.              Expected Discharge Plan: Home/Self Care Barriers to Discharge: No Barriers Identified   Patient Goals and CMS Choice        Expected Discharge Plan and Services Expected Discharge Plan: Home/Self Care In-house Referral: Clinical Social Work     Living arrangements for the past 2 months: Single Family Home                                      Prior Living Arrangements/Services Living arrangements for the past 2 months: Single Family Home Lives with:: Self Patient language and need for interpreter reviewed:: Yes        Need for Family Participation in Patient Care: Yes (Comment) Care giver support system in place?: Yes (comment)   Criminal Activity/Legal Involvement Pertinent to Current Situation/Hospitalization: No - Comment as  needed  Activities of Daily Living Home Assistive Devices/Equipment: None ADL Screening (condition at time of admission) Patient's cognitive ability adequate to safely complete daily activities?: Yes Is the patient deaf or have difficulty hearing?: No Does the patient have difficulty seeing, even when wearing glasses/contacts?: No Does the patient have difficulty concentrating, remembering, or making decisions?: No Patient able to express need for assistance with ADLs?: Yes Does the patient have difficulty dressing or bathing?: No Independently performs ADLs?: Yes (appropriate for developmental age) Does the patient have difficulty walking or climbing stairs?: Yes (secondary to weakness) Weakness of Legs: Both Weakness of Arms/Hands: None  Permission Sought/Granted                  Emotional Assessment Appearance:: Appears stated age Attitude/Demeanor/Rapport: Engaged Affect (typically observed): Appropriate Orientation: : Oriented to Self,Oriented to Place,Oriented to  Time,Oriented to Situation Alcohol / Substance Use: Alcohol Use Psych Involvement: No (comment)  Admission diagnosis:  Altered mental status [R41.82] Hypoglycemia [E16.2] Elevated liver enzymes [R74.8] Alcohol withdrawal syndrome without complication (HCC) [F10.230] Sepsis, due to unspecified organism, unspecified whether acute organ dysfunction present [facility]) [A41.9] Patient Active Problem List   Diagnosis Date Noted  . Altered mental status 02/13/2021  . Alcohol abuse 02/13/2021  . Hypoglycemia 02/13/2021  . Hypomagnesemia  02/13/2021  . Hyponatremia 02/13/2021  . Elevated liver enzymes 02/13/2021  . Low body weight due to inadequate caloric intake 02/13/2021  . Prolonged QT interval 02/13/2021  . Cough 09/01/2017  . Lower resp. tract infection 09/01/2017  . Wheezing 09/01/2017  . Encounter for Depo-Provera contraception 09/08/2016   PCP:  Oneita Hurt No Pharmacy:   [facility] [address]  ([address] [place] [address] [place]) [place] [address] Phone: [phone] Fax: [phone]     Social Determinants of Health (SDOH) Interventions    Readmission Risk Interventions No flowsheet data found.

## 2021-02-13 NOTE — H&P (Signed)
History and Physical    Amy Cohen:[id number] DOB: [birth year] DOA: 02/12/2021  PCP: Pcp, No   Patient coming from: Home  Chief Complaint: Unresponsive  HPI: Amy Cohen is a 30 y.o. female with medical history significant for alcohol abuse who presents by EMS after being found unresponsive at home.  Husband reports that he came home for lunch and found her on the ground.  She had loss of bladder function and was incontinent.  When he first arrived she was laying on the ground with her eyes open but could not talk or move.  He did not notice any jerking or shaking of her extremities.  She has no history of seizures.  She states that she was walking in she thought she tripped and fell to the ground but she does not really remember the events of the morning.  She states that she remembers feeling very weak and is not sure how long she laid on the ground.  She reports that she feels very shaky and weak but has not had any fever, chest pain or shortness of breath.  She denies any nausea or vomiting.  She does not take any medications at home and is not on any supplements.  He does drink alcohol daily drinking 5-7 shots of vodka a night.  She smokes 1 to 2 cigarettes a day.  She denies illicit drug use  ED Course:  In the emergency room she has been hemodynamically stable.  Lactic acid was elevated initially at 5.7 and repeat level was 3.9.  CK was elevated at 500.  She had a low sodium of 128 and a low magnesium of 1.3.  Glucose has improved was 299 in the emergency room on lab draw.  She was found to have elevated WBC of 33,000 with no source of infection.  Head CT was negative.  Chest x-ray was negative.  Urinalysis is negative for infection.  AST is 454 and ALT 129 with a total bilirubin of 1.3.  Hospitalist service has been asked to admit for further management  Review of Systems:  General: Denies fever, chills, weight loss, night sweats.  Denies dizziness.  Denies change in appetite HENT:  Denies head trauma, headache, denies change in hearing, tinnitus.  Denies nasal congestion or bleeding.  Denies sore throat, sores in mouth.  Denies difficulty swallowing Eyes: Denies blurry vision, pain in eye, drainage.  Denies discoloration of eyes. Neck: Denies pain.  Denies swelling.  Denies pain with movement. Cardiovascular: Denies chest pain, palpitations.  Denies edema.  Denies orthopnea Respiratory: Denies shortness of breath, cough.  Denies wheezing.  Denies sputum production Gastrointestinal: Denies abdominal pain, swelling.  Denies nausea, vomiting, diarrhea.  Denies melena.  Denies hematemesis. Musculoskeletal: Denies limitation of movement.  Denies deformity or swelling.  Denies pain.  Denies arthralgias or myalgias. Genitourinary: Denies pelvic pain.  Denies urinary frequency or hesitancy.  Denies dysuria.  Skin: Denies rash.  Denies petechiae, purpura, ecchymosis. Neurological: Reports generalized weakness.  Orts has had intermittent paresthesia in toes and hands for the last week denies headache.  Denies syncope  Denies slurred speech, drooping face.  Denies visual change. Psychiatric: Denies depression, anxiety.  Denies hallucinations.  History reviewed. No pertinent past medical history.  Past Surgical History:  Procedure Laterality Date  . WISDOM TOOTH EXTRACTION      Social History  reports that she quit smoking about 5 years ago. Her smoking use included cigarettes. She has a 0.75 pack-year smoking history. She has never  used smokeless tobacco. She reports current alcohol use of about 23.0 standard drinks of alcohol per week. She reports that she does not use drugs.  No Known Allergies  Family History  Problem Relation Age of Onset  . Bursitis Mother   . Vitiligo Mother   . Autism Brother   . Heart disease Maternal Grandfather   . Hypertension Paternal Grandmother   . Cancer Maternal Aunt      Prior to Admission medications   Medication Sig Start Date End  Date Taking? Authorizing Provider  fluticasone (FLONASE) 50 MCG/ACT nasal spray Place 1 spray into both nostrils daily as needed for allergies or rhinitis.   Yes [provider]  ibuprofen (ADVIL) 200 MG tablet Take 600 mg by mouth every 6 (six) hours as needed for headache.   Yes [provider]  Polyvinyl Alcohol-Povidone (CLEAR EYES ALL SEASONS OP) Place 1 drop into both eyes daily as needed (irritation).   Yes [provider]  ibuprofen (ADVIL,MOTRIN) 600 MG tablet Take 1 tablet (600 mg total) by mouth every 6 (six) hours. Patient not taking: No sig reported 12/06/15   Jacklyn ShellCresenzo-Dishmon, Frances, CNM    Physical Exam: Vitals:   02/12/21 2330 02/12/21 2345 02/12/21 2353 02/13/21 0000  BP: 129/79 129/78  127/76  Pulse: (!) 110 (!) 112  (!) 106  Resp: (!) 24 18  19   Temp:      TempSrc:      SpO2: 100% 100%  100%  Weight:   49.9 kg     Constitutional: NAD, calm, comfortable Vitals:   02/12/21 2330 02/12/21 2345 02/12/21 2353 02/13/21 0000  BP: 129/79 129/78  127/76  Pulse: (!) 110 (!) 112  (!) 106  Resp: (!) 24 18  19   Temp:      TempSrc:      SpO2: 100% 100%  100%  Weight:   49.9 kg    General: WDWN, Alert and oriented x3.  Eyes: EOMI, PERRL, conjunctivae normal.  Sclera nonicteric HENT:  [place]/AT, external ears normal.  Nares patent without epistasis.  Mucous membranes are dry. Posterior pharynx clear of any exudate or lesions. Normal dentition.  Neck: Soft, normal range of motion, supple, no masses, no thyromegaly.  Trachea midline Respiratory: clear to auscultation bilaterally, no wheezing, no crackles. Normal respiratory effort. No accessory muscle use.  Cardiovascular: Regular rate and rhythm, no murmurs / rubs / gallops. No extremity edema. 2+ pedal pulses. Abdomen: Soft, no tenderness, nondistended, no rebound or guarding.  No masses palpated. No hepatosplenomegaly. Bowel sounds normoactive Musculoskeletal: FROM. no cyanosis. No joint deformity  upper and lower extremities. Normal muscle tone.  Skin: Warm, dry, intact no rashes, lesions, ulcers. No induration Neurologic: CN 2-12 grossly intact. Normal speech. Sensation intact, patella DTR +1 bilaterally. Strength 3/5 in all extremities.   Psychiatric: Normal judgment and insight.  Normal mood.    Labs on Admission: I have personally reviewed following labs and imaging studies  CBC: Recent Labs  Lab 02/12/21 1821  WBC 33.3*  NEUTROABS 31.3*  HGB 12.9  HCT 43.2  MCV 86.9  PLT 231    Basic Metabolic Panel: Recent Labs  Lab 02/12/21 1821 02/12/21 2200  NA 128*  --   K 5.1  --   CL 99  --   CO2 10*  --   GLUCOSE 299*  --   BUN 10  --   CREATININE 0.94  --   CALCIUM 8.4*  --   MG  --  1.3*  GFR: CrCl cannot be calculated (Unknown ideal weight.).  Liver Function Tests: Recent Labs  Lab 02/12/21 1821  AST 454*  ALT 129*  ALKPHOS 76  BILITOT 1.3*  PROT 9.1*  ALBUMIN 4.9    Urine analysis:    Component Value Date/Time   COLORURINE YELLOW 02/12/2021 1945   APPEARANCEUR CLEAR 02/12/2021 1945   LABSPEC 1.009 02/12/2021 1945   PHURINE 5.0 02/12/2021 1945   GLUCOSEU >=500 (A) 02/12/2021 1945   HGBUR LARGE (A) 02/12/2021 1945   BILIRUBINUR NEGATIVE 02/12/2021 1945   KETONESUR 80 (A) 02/12/2021 1945   PROTEINUR 30 (A) 02/12/2021 1945   UROBILINOGEN 0.2 11/26/2015 1548   NITRITE NEGATIVE 02/12/2021 1945   LEUKOCYTESUR NEGATIVE 02/12/2021 1945    Radiological Exams on Admission: DG Chest 2 View  Result Date: 02/12/2021 CLINICAL DATA:  Weakness, fell, low back pain EXAM: CHEST - 2 VIEW COMPARISON:  09/01/2017 FINDINGS: The heart size and mediastinal contours are within normal limits. Both lungs are clear. The visualized skeletal structures are unremarkable. IMPRESSION: No active cardiopulmonary disease. Electronically Signed   By: Sharlet Salina M.D.   On: 02/12/2021 22:44   DG Lumbar Spine Complete  Result Date: 02/12/2021 CLINICAL DATA:  Larey Seat, back  pain and bruising EXAM: LUMBAR SPINE - COMPLETE 4+ VIEW COMPARISON:  None. FINDINGS: Frontal, bilateral oblique, and lateral views of the lumbar spine are obtained. There are 6 non-rib-bearing lumbar type vertebral bodies in normal alignment. No fractures. Disc spaces are well preserved. Sacroiliac joints are normal. IMPRESSION: 1. 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 2. No acute bony abnormality. Electronically Signed   By: Sharlet Salina M.D.   On: 02/12/2021 22:45   CT Head Wo Contrast  Result Date: 02/12/2021 CLINICAL DATA:  Headache and syncopal episode. EXAM: CT HEAD WITHOUT CONTRAST TECHNIQUE: Contiguous axial images were obtained from the base of the skull through the vertex without intravenous contrast. COMPARISON:  None. FINDINGS: Brain: No evidence of acute infarction, hemorrhage, hydrocephalus, extra-axial collection or mass lesion/mass effect. Vascular: No hyperdense vessel or unexpected calcification. Skull: Normal. Negative for fracture or focal lesion. Sinuses/Orbits: No acute finding. Other: None. IMPRESSION: No acute intracranial abnormality. Electronically Signed   By: Aram Candela M.D.   On: 02/12/2021 21:15    EKG: Independently reviewed.  EKG shows sinus tachycardia with nonspecific ST changes.  Right atrial enlargement.  QTc prolonged at 547  Assessment/Plan Principal Problem:   Hypoglycemia Ms. Cassels is admitted to telemetry floor. She initially had a blood sugar of 29 by EMS.  Was given dextrose and blood sugar has improved.  She has no history of diabetes or hypoglycemia and is not on any antiglycemic medications Patient potentially had a seizure that was unwitnessed at home which would have led to hypoglycemia  Active Problems:   Altered mental status Patient was found at home by her husband initially she was unresponsive and could not move or speak and was on the floor.  She had lost control of her bladder and had  incontinence.  No witnessed seizures.  Patient is unsure of what happened. She does have a history of alcohol abuse and last drink the previous evening.  She does have history of withdrawals in the past Obtain MRI brain and EEG in the morning for further evaluation    Hypomagnesemia Magnesium replaced in the emergency room.  Recheck level tomorrow    Hyponatremia Most likely secondary to alcohol abuse.  IV fluid hydration Recheck electrolytes renal function morning  Elevated liver enzymes Liver enzymes are elevated.  We will recheck with labs in the morning. Obtain ultrasound of liver    Leukocytosis Elevated WBC with no signs of infection. Most likely secondary to a seizure. Repeat CBC in am.    Prolonged QT interval Avoid medications which could further prolong QT interval.  Monitor on telemetry.    Alcohol abuse Placed on CIWA protocol.  Ativan will be provided as needed for elevated CIWA scores and signs of withdrawal.  Consult social work for outpatient alcohol programs    Low body weight due to inadequate caloric intake Nutrition services for evaluation in the morning    DVT prophylaxis: Lovenox for DVT prophylaxis Code Status:   Full code Family Communication:  Diagnosis and plan discussed with patient and family member at bedside.  They verbalized understanding and agree with plan.  Further recommendations to follow as clinically indicated Disposition Plan:   Patient is from:  Home  Anticipated DC to:  Home  Anticipated DC date:  Anticipate more than 2 midnight stay in the hospital  Anticipated DC barriers: No barriers to discharge identified at this time  Admission status:  Inpatient   Claudean Severance Danelle Curiale MD [facility]  How to contact the [facility] Attending or Consulting provider 7A - 7P or covering provider during after hours 7P -7A, for this patient?   1. Check the care team in [facility] and look for a) attending/consulting [facility] provider listed and b) the [facility]  team listed 2. Log into [email or web address] and use [place]'s universal password to access. If you do not have the password, please contact the hospital operator. 3. Locate the [facility] provider you are looking for under [facility] and page to a number that you can be directly reached. 4. If you still have difficulty reaching the provider, please page the [facility] (Director on Call) for the Hospitalists listed on amion for assistance.  02/13/2021, 1:46 AM

## 2021-02-13 NOTE — ED Notes (Signed)
Patient is resting comfortably. 

## 2021-02-13 NOTE — ED Notes (Addendum)
Attempted X4 to obtain second set of blood cultures. Unable due to pt being a hard stick. No other staff members able to assist at this time. Prioritizing administration of antibiotics.

## 2021-02-13 NOTE — Care Plan (Signed)
This 30 years old female with PMH significant for alcohol abuse presents in the ED after being found unresponsive at home.  Husband reports he found her lying on the floor with eyes open but could not talk or move after he came back home.  She had a loss of bladder function and was incontinent.  He did not notice any jerking or shaking of her extremities.  She does not have any history of seizure disorder.  Patient states she thought she tripped and fell on the ground but does not remember the event.  She reports feeling very weak and not sure how long she laid on the ground. In the ED her lactic acid was found to be 5.7, CK 500, sodium 128, magnesium 1.3, glucose 299, WBC 33,000, no source of infection found, urine analysis negative, CT head was negative, chest x-ray unremarkable.Patient was admitted for altered mental status, no witnessed seizures.  She does have a history of alcohol abuse and the last drink was in the evening before this happened. MRI: No acute abnormality noted.  EEG pending Leukocytosis could be reactive, continue to trend WBCs.  Patient placed on CIWA protocol for alcohol abuse. Patient was seen at bedside, remains alert and awake.

## 2021-02-13 NOTE — ED Notes (Signed)
Reached out to [employer] about pt in room 22. Per RN she has one level 1 pt and 3 level 2 pt's at the moment. RN unable to ask MRI question due to workload. Rn informed, MRI would come back to this pt when possible. Mri will go on to the next pt to optimized MRI table time and patient care. Will come back to pt later this am.

## 2021-02-14 ENCOUNTER — Inpatient Hospital Stay (HOSPITAL_COMMUNITY)
Admit: 2021-02-14 | Discharge: 2021-02-14 | Disposition: A | Discharge status: Home / Self Care | Payer: BC Managed Care – PPO | Attending: Family Medicine | Admitting: Family Medicine

## 2021-02-14 DIAGNOSIS — E162 Hypoglycemia, unspecified: Principal | ICD-10-CM

## 2021-02-14 DIAGNOSIS — R4182 Altered mental status, unspecified: Secondary | ICD-10-CM

## 2021-02-14 LAB — BASIC METABOLIC PANEL
Anion gap: 9 (ref 5–15)
BUN: <5 mg/dL — ABNORMAL LOW (ref 6–20)
CO2: 22 mmol/L (ref 22–32)
Calcium: 8.6 mg/dL — ABNORMAL LOW (ref 8.9–10.3)
Chloride: 107 mmol/L (ref 98–111)
Creatinine, Ser: 0.6 mg/dL (ref 0.44–1.00)
GFR, Estimated: >60 mL/min (ref >60)
Glucose, Bld: 88 mg/dL (ref 70–99)
Potassium: 3.6 mmol/L (ref 3.5–5.1)
Sodium: 138 mmol/L (ref 135–145)

## 2021-02-14 LAB — CBC
HCT: 35.3 % — ABNORMAL LOW (ref 36.0–46.0)
Hemoglobin: 11.5 g/dL — ABNORMAL LOW (ref 12.0–15.0)
MCH: 26.4 pg (ref 26.0–34.0)
MCHC: 32.6 g/dL (ref 30.0–36.0)
MCV: 81 fL (ref 80.0–100.0)
Platelets: 149 10*3/uL — ABNORMAL LOW (ref 150–400)
RBC: 4.36 MIL/uL (ref 3.87–5.11)
RDW: 13.8 % (ref 11.5–15.5)
WBC: 14.1 10*3/uL — ABNORMAL HIGH (ref 4.0–10.5)
nRBC: 0 % (ref 0.0–0.2)

## 2021-02-14 LAB — PHOSPHORUS: Phosphorus: 2 mg/dL — ABNORMAL LOW (ref 2.5–4.6)

## 2021-02-14 LAB — MAGNESIUM: Magnesium: 1.9 mg/dL (ref 1.7–2.4)

## 2021-02-14 LAB — GLUCOSE, CAPILLARY: Glucose-Capillary: 92 mg/dL (ref 70–99)

## 2021-02-14 MED ORDER — POTASSIUM PHOSPHATES 15 MMOLE/5ML IV SOLN
30.0000 mmol | Freq: Once | INTRAVENOUS | Status: AC
  Administered 2021-02-14: 30 mmol via INTRAVENOUS
  Filled 2021-02-14: qty 10

## 2021-02-14 NOTE — Progress Notes (Signed)
PROGRESS NOTE    Amy Cohen  DVV:[id number] DOB: December 16, 1990 DOA: 02/12/2021 PCP: Pcp, No   Brief Narrative: This 30 years old female with PMH significant for alcohol abuse presents in the ED after being found unresponsive at home.  Husband reports he found her lying on the floor with eyes open but could not talk or move after he came back home.  She had a loss of bladder function and was incontinent.  He did not notice any jerking or shaking of her extremities.  She does not have any history of seizure disorder.  Patient states she thought she tripped and fell on the ground but does not remember the event.  She reports feeling very weak and not sure how long she laid on the ground.  Her blood sugar was found to be 29 by EMS and was given dextrose and blood sugar. Patient denies any history of diabetes or hypoglycemia. In the ED her lactic acid was found to be 5.7, CK 500, sodium 128, magnesium 1.3, glucose 299, WBC 33,000, no source of infection found, urine analysis negative, CT head was negative, chest x-ray unremarkable. Patient was admitted for altered mental status, no witnessed seizures.  She does have a history of alcohol abuse and the last drink was in the evening before this happened. MRI: No acute abnormality noted.  EEG : No evidence of seizures. Leukocytosis could be reactive, continue to trend WBCs.  Patient placed on CIWA protocol for alcohol abuse.  Assessment & Plan:   Principal Problem:   Hypoglycemia Active Problems:   Altered mental status   Alcohol abuse   Hypomagnesemia   Hyponatremia   Elevated liver enzymes   Low body weight due to inadequate caloric intake   Prolonged QT interval   Hypoglycemia : Improved. She was found unresponsive on the floor. She is found to have blood sugar of 29 by EMS, she was given dextrose and blood sugar has improved. She denies any history of diabetes or hypoglycemia and is not on any antihyperglycemic medications.   She might have a  seizure that was unwitnessed that would have led to hypoglycemia.   Altered mental status > Resolved Patient was found at home by her husband initially she was unresponsive and could not move or speak and was on the floor.   She had lost control of her bladder and had incontinence.  No witnessed seizures.  Patient is unsure of what happened. She does have a history of alcohol abuse and last drink the previous evening.  She does have history of withdrawals in the past MRI no acute abnormality.  EEG no evidence of seizures.  Hypomagnesemia Replaced and improved.  Hyponatremia Most likely secondary to alcohol abuse.  IV fluid hydration Improved.  Elevated liver enzymes. Liver enzymes are elevated.    Recheck labs tomorrow. Abdominal ultrasound shows fatty steatosis.  Leukocytosis > Improving Elevated WBC with no signs of infection.  Most likely secondary to a seizure. Repeat CBC in am.  Prolonged QT interval Avoid medications which could further prolong QT interval.  Monitor on telemetry.  Alcohol abuse Placed on CIWA protocol.  Ativan will be provided as needed for elevated CIWA scores and signs of withdrawal.   Consult social work for outpatient alcohol programs  Low body weight due to inadequate caloric intake Nutrition services for evaluation in the morning    DVT prophylaxis: Lovenox Code Status: Full code Family Communication: Husband at bedside Disposition Plan:   Status is: Inpatient  Remains inpatient  appropriate because:Inpatient level of care appropriate due to severity of illness   Dispo: The patient is from: Home              Anticipated d/c is to: Home              Patient currently is not medically stable to d/c.   Difficult to place patient No   Consultants:   [facility] Procedures: MRI, EEG, CT head Antimicrobials:    Anti-infectives (From admission, onward)   Start     Dose/Rate Route Frequency Ordered Stop   02/12/21 2345  ceFEPIme  (MAXIPIME) 2 g in sodium chloride 0.9 % 100 mL IVPB        2 g 200 mL/hr over 30 Minutes Intravenous  Once 02/12/21 2335 02/13/21 0105   02/12/21 2345  metroNIDAZOLE (FLAGYL) IVPB 500 mg        500 mg 100 mL/hr over 60 Minutes Intravenous  Once 02/12/21 2335 02/13/21 0105   02/12/21 2345  vancomycin (VANCOCIN) IVPB 1000 mg/200 mL premix        1,000 mg 200 mL/hr over 60 Minutes Intravenous  Once 02/12/21 2335 02/13/21 0105      Subjective: Patient was seen and examined at bedside.  Overnight events noted.  Patient seems much improved.  She was having EEG.  She reports improved.  Objective: Vitals:   02/13/21 2027 02/14/21 0345 02/14/21 0400 02/14/21 1320  BP: 121/76 114/85 114/85 122/85  Pulse: 84 64 64 76  Resp: Temp: 98.2 F (36.8 C) 98.1 F (36.7 C)  98.2 F (36.8 C)  TempSrc: Oral Oral  Oral  SpO2: 100% 100%  100%  Weight:        Intake/Output Summary (Last 24 hours) at 02/14/2021 1643 Last data filed at 02/14/2021 0300 Gross per 24 hour  Intake 2182.7 ml  Output --  Net 2182.7 ml   Filed Weights   02/12/21 2353  Weight: 49.9 kg    Examination:  General exam: Appears calm and comfortable, not in any acute distress. Respiratory system: Clear to auscultation. Respiratory effort normal. Cardiovascular system: S1 & S2 heard, RRR. No JVD, murmurs, rubs, gallops or clicks. No pedal edema. Gastrointestinal system: Abdomen is nondistended, soft and nontender. No organomegaly or masses felt. Normal bowel sounds heard. Central nervous system: Alert and oriented. No focal neurological deficits. Extremities: Symmetric 5 x 5 power. Skin: No rashes, lesions or ulcers Psychiatry: Judgement and insight appear normal. Mood & affect appropriate.     Data Reviewed: I have personally reviewed following labs and imaging studies  CBC: Recent Labs  Lab 02/12/21 1821 02/13/21 0430 02/14/21 0417  WBC 33.3* 24.2* 14.1*  NEUTROABS 31.3*  --   --   HGB 12.9 12.0 11.5*   HCT 43.2 37.4 35.3*  MCV 86.9 82.9 81.0  PLT 231 176 149*   Basic Metabolic Panel: Recent Labs  Lab 02/12/21 1821 02/12/21 2200 02/13/21 0430 02/14/21 0417  NA 128*  --  143 138  K 5.1  --  4.5 3.6  CL 99  --  117* 107  CO2 10*  --  17* 22  GLUCOSE 299*  --  103* 88  BUN 10  --  8 <5*  CREATININE 0.94  --  0.39* 0.60  CALCIUM 8.4*  --  7.5* 8.6*  MG  --  1.3* 2.0 1.9  PHOS  --   --  1.4* 2.0*   GFR: CrCl cannot be calculated (Unknown ideal weight.). Liver  Function Tests: Recent Labs  Lab 02/12/21 1821 02/13/21 0430  AST 454* 188*  ALT 129* 81*  ALKPHOS 76 45  BILITOT 1.3* 0.7  PROT 9.1* 6.5  ALBUMIN 4.9 3.5   No results for input(s): LIPASE, AMYLASE in the last 168 hours. No results for input(s): AMMONIA in the last 168 hours. Coagulation Profile: No results for input(s): INR, PROTIME in the last 168 hours. Cardiac Enzymes: Recent Labs  Lab 02/12/21 2200  CKTOTAL 500*   BNP (last 3 results) No results for input(s): PROBNP in the last 8760 hours. HbA1C: No results for input(s): HGBA1C in the last 72 hours. CBG: Recent Labs  Lab 02/13/21 0610 02/13/21 0818 02/13/21 1310 02/13/21 1703 02/14/21 0731  GLUCAP 91 81 155* 123* 92   Lipid Profile: No results for input(s): CHOL, HDL, LDLCALC, TRIG, CHOLHDL, LDLDIRECT in the last 72 hours. Thyroid Function Tests: No results for input(s): TSH, T4TOTAL, FREET4, T3FREE, THYROIDAB in the last 72 hours. Anemia Panel: Recent Labs    02/12/21 2200  VITAMINB12 405   Sepsis Labs: Recent Labs  Lab 02/12/21 2200 02/12/21 2330 02/13/21 0430  LATICACIDVEN 5.7* 3.9* 1.4    Recent Results (from the past 240 hour(s))  Culture, blood (routine x 2)     Status: None (Preliminary result)   Collection Time: 02/12/21 10:00 PM   Specimen: BLOOD  Result Value Ref Range Status   Specimen Description   Final    BLOOD LEFT ANTECUBITAL Performed at [facility], [address] [place], [place]  [address]    Special Requests   Final    BOTTLES DRAWN AEROBIC AND ANAEROBIC Blood Culture adequate volume Performed at [facility], [address] [place], [place] [address]    Culture   Final    NO GROWTH 1 DAY Performed at [facility], [address] [place], [place] [address]    Report Status PENDING  Incomplete  SARS CORONAVIRUS 2 (TAT 6-24 HRS) Nasopharyngeal Nasopharyngeal Swab     Status: None   Collection Time: 02/12/21 10:00 PM   Specimen: Nasopharyngeal Swab  Result Value Ref Range Status   SARS Coronavirus 2 NEGATIVE NEGATIVE Final    Comment: (NOTE) SARS-CoV-2 target nucleic acids are NOT DETECTED.  The SARS-CoV-2 RNA is generally detectable in upper and lower respiratory specimens during the acute phase of infection. Negative results do not preclude SARS-CoV-2 infection, do not rule out co-infections with other pathogens, and should not be used as the sole basis for treatment or other patient management decisions. Negative results must be combined with clinical observations, patient history, and epidemiological information. The expected result is Negative.  Fact Sheet for Patients: HairSlick.nohttps://www.fda.gov/media/138098/download  Fact Sheet for Healthcare Providers: quierodirigir.comhttps://www.fda.gov/media/138095/download  This test is not yet approved or cleared by the [place] FDA and  has been authorized for detection and/or diagnosis of SARS-CoV-2 by FDA under an Emergency Use Authorization (EUA). This EUA will remain  in effect (meaning this test can be used) for the duration of the COVID-19 declaration under Se ction 564(b)(1) of the Act, 21 U.S.C. section 360bbb-3(b)(1), unless the authorization is terminated or revoked sooner.  Performed at [facility], [address] [place], [place] [address]   Culture, blood (routine x 2)     Status: None (Preliminary result)   Collection Time: 02/13/21  9:27 AM   Specimen: BLOOD LEFT HAND   Result Value Ref Range Status   Specimen Description   Final  BLOOD LEFT HAND Performed at [facility], [address], [place], [place] [address]    Special Requests   Final    BOTTLES DRAWN AEROBIC AND ANAEROBIC Blood Culture adequate volume Performed at [facility], [address], [place], [place] [address]    Culture   Final    NO GROWTH < 24 HOURS Performed at [facility], [address], [place], [place] [address]    Report Status PENDING  Incomplete    Radiology Studies: DG Chest 2 View  Result Date: 02/12/2021 CLINICAL DATA:  Weakness, fell, low back pain EXAM: CHEST - 2 VIEW COMPARISON:  09/01/2017 FINDINGS: The heart size and mediastinal contours are within normal limits. Both lungs are clear. The visualized skeletal structures are unremarkable. IMPRESSION: No active cardiopulmonary disease. Electronically Signed   By: Sharlet Salina M.D.   On: 02/12/2021 22:44   DG Lumbar Spine Complete  Result Date: 02/12/2021 CLINICAL DATA:  Larey Seat, back pain and bruising EXAM: LUMBAR SPINE - COMPLETE 4+ VIEW COMPARISON:  None. FINDINGS: Frontal, bilateral oblique, and lateral views of the lumbar spine are obtained. There are 6 non-rib-bearing lumbar type vertebral bodies in normal alignment. No fractures. Disc spaces are well preserved. Sacroiliac joints are normal. IMPRESSION: 1. 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 2. No acute bony abnormality. Electronically Signed   By: Sharlet Salina M.D.   On: 02/12/2021 22:45   CT Head Wo Contrast  Result Date: 02/12/2021 CLINICAL DATA:  Headache and syncopal episode. EXAM: CT HEAD WITHOUT CONTRAST TECHNIQUE: Contiguous axial images were obtained from the base of the skull through the vertex without intravenous contrast. COMPARISON:  None. FINDINGS: Brain: No evidence of acute infarction, hemorrhage, hydrocephalus, extra-axial collection or mass lesion/mass  effect. Vascular: No hyperdense vessel or unexpected calcification. Skull: Normal. Negative for fracture or focal lesion. Sinuses/Orbits: No acute finding. Other: None. IMPRESSION: No acute intracranial abnormality. Electronically Signed   By: Aram Candela M.D.   On: 02/12/2021 21:15   MR BRAIN WO CONTRAST  Result Date: 02/13/2021 CLINICAL DATA:  Seizure, alcohol/drug related. EXAM: MRI HEAD WITHOUT CONTRAST TECHNIQUE: Multiplanar, multiecho pulse sequences of the brain and surrounding structures were obtained without intravenous contrast. COMPARISON:  CT head Feb 12, 2021. FINDINGS: Brain: No acute infarction, hemorrhage, hydrocephalus, extra-axial collection or mass lesion. The hippocampi appear within normal limits and symmetric in size/signal. Vascular: Major arterial flow voids are maintained at the skull base. Skull and upper cervical spine: Normal marrow signal. Sinuses/Orbits: Clear sinuses.  Unremarkable orbits. Other: No mastoid effusions. IMPRESSION: No evidence of acute intracranial abnormality. Electronically Signed   By: Feliberto Harts MD   On: 02/13/2021 11:04   EEG adult  Result Date: 02/14/2021 Charlsie Quest, MD     02/14/2021  1:33 PM Patient Name: ISRAEL WUNDER MRN: [id number] Epilepsy Attending: Charlsie Quest Referring Physician/Provider: Dr Trey Paula Date: 02/14/2021 Duration: 23.07 mins Patient history: 30 year old female with altered mental status.  EEG to evaluate for seizures. Level of alertness: Awake, asleep AEDs during EEG study: Ativan Technical aspects: This EEG study was done with scalp electrodes positioned according to the 10-20 International system of electrode placement. Electrical activity was acquired at a sampling rate of 500Hz  and reviewed with a high frequency filter of 70Hz  and a low frequency filter of 1Hz . EEG data were recorded continuously and digitally stored. Description: During awake state, no clear posterior dominant rhythm was seen.  Sleep was  characterized by  vertex waves, sleep spindles (12 to 14 Hz), maximal frontocentral region.  There is an excessive amount of 15 to 18 Hz beta activity distributed symmetrically and diffusely. Physiologic photic driving was seen during photic stimulation. Hyperventilation was not performed.   ABNORMALITY - Excessive beta, generalized IMPRESSION: This study is within normal limits. The excessive beta activity seen in the background is most likely due to the effect of benzodiazepine and is a benign EEG pattern. No seizures or epileptiform discharges were seen throughout the recording. Priyanka Annabelle Harman   US Abdomen Limited RUQ (LIVER/GB)  Result Date: 02/13/2021 CLINICAL DATA:  Elevated liver enzymes EXAM: ULTRASOUND ABDOMEN LIMITED RIGHT UPPER QUADRANT COMPARISON:  None. FINDINGS: Gallbladder: No gallstones or wall thickening visualized. No sonographic Murphy sign noted by sonographer. Common bile duct: Diameter: 3 mm. Liver: Echogenic liver with Peri fissural sparing. No focal masslike finding portal vein is patent on color Doppler imaging with normal direction of blood flow towards the liver. Other: On the first few images hypoechoic lobulated appearance left of the liver is likely volume averaging in the chest. Subsequent images show no retroperitoneal adenopathy/mass. IMPRESSION: Hepatic steatosis. Electronically Signed   By: Marnee Spring M.D.   On: 02/13/2021 05:50   Scheduled Meds: . enoxaparin (LOVENOX) injection  40 mg Subcutaneous Q24H  . folic acid  1 mg Oral Daily  . LORazepam  0-4 mg Intravenous Q6H   Followed by  . [START ON 02/15/2021] LORazepam  0-4 mg Intravenous Q12H  . multivitamin with minerals  1 tablet Oral Daily  . thiamine  100 mg Oral Daily   Or  . thiamine  100 mg Intravenous Daily   Continuous Infusions: . lactated ringers 75 mL/hr at 02/14/21 1038     LOS: 1 day    Time spent: 35 mins    Alabama Doig, MD [facility]   If 7PM-7AM, please contact  night-coverage

## 2021-02-14 NOTE — Procedures (Signed)
Patient Name: SHRUTI ARREY  MRN: [id number]  Epilepsy Attending: Charlsie Quest  Referring Physician/Provider: Dr Trey Paula Date: 02/14/2021 Duration: 23.07 mins  Patient history: 30 year old female with altered mental status.  EEG to evaluate for seizures.  Level of alertness: Awake, asleep  AEDs during EEG study: Ativan  Technical aspects: This EEG study was done with scalp electrodes positioned according to the 10-20 International system of electrode placement. Electrical activity was acquired at a sampling rate of 500Hz  and reviewed with a high frequency filter of 70Hz  and a low frequency filter of 1Hz . EEG data were recorded continuously and digitally stored.   Description: During awake state, no clear posterior dominant rhythm was seen.  Sleep was characterized by vertex waves, sleep spindles (12 to 14 Hz), maximal frontocentral region.  There is an excessive amount of 15 to 18 Hz beta activity distributed symmetrically and diffusely. Physiologic photic driving was seen during photic stimulation. Hyperventilation was not performed.     ABNORMALITY - Excessive beta, generalized  IMPRESSION: This study is within normal limits. The excessive beta activity seen in the background is most likely due to the effect of benzodiazepine and is a benign EEG pattern. No seizures or epileptiform discharges were seen throughout the recording.  Chalet Kerwin 

## 2021-02-14 NOTE — Progress Notes (Signed)
EEG Completed; Results Pending  

## 2021-02-15 LAB — COMPREHENSIVE METABOLIC PANEL
ALT: 58 U/L — ABNORMAL HIGH (ref 0–44)
AST: 89 U/L — ABNORMAL HIGH (ref 15–41)
Albumin: 3.6 g/dL (ref 3.5–5.0)
Alkaline Phosphatase: 55 U/L (ref 38–126)
Anion gap: 8 (ref 5–15)
BUN: <5 mg/dL — ABNORMAL LOW (ref 6–20)
CO2: 25 mmol/L (ref 22–32)
Calcium: 9 mg/dL (ref 8.9–10.3)
Chloride: 106 mmol/L (ref 98–111)
Creatinine, Ser: 0.56 mg/dL (ref 0.44–1.00)
GFR, Estimated: >60 mL/min (ref >60)
Glucose, Bld: 92 mg/dL (ref 70–99)
Potassium: 3.6 mmol/L (ref 3.5–5.1)
Sodium: 139 mmol/L (ref 135–145)
Total Bilirubin: 0.3 mg/dL (ref 0.3–1.2)
Total Protein: 6.7 g/dL (ref 6.5–8.1)

## 2021-02-15 LAB — PHOSPHORUS: Phosphorus: 3.3 mg/dL (ref 2.5–4.6)

## 2021-02-15 LAB — CBC
HCT: 35.7 % — ABNORMAL LOW (ref 36.0–46.0)
Hemoglobin: 11.6 g/dL — ABNORMAL LOW (ref 12.0–15.0)
MCH: 26.6 pg (ref 26.0–34.0)
MCHC: 32.5 g/dL (ref 30.0–36.0)
MCV: 81.9 fL (ref 80.0–100.0)
Platelets: 132 10*3/uL — ABNORMAL LOW (ref 150–400)
RBC: 4.36 MIL/uL (ref 3.87–5.11)
RDW: 13.9 % (ref 11.5–15.5)
WBC: 7.3 10*3/uL (ref 4.0–10.5)
nRBC: 0 % (ref 0.0–0.2)

## 2021-02-15 LAB — MAGNESIUM: Magnesium: 1.7 mg/dL (ref 1.7–2.4)

## 2021-02-15 MED ORDER — THIAMINE HCL 100 MG PO TABS: 100.0000 mg | ORAL_TABLET | Freq: Every day | ORAL | 1 refills | Status: AC

## 2021-02-15 MED ORDER — FOLIC ACID 1 MG PO TABS: 1.0000 mg | ORAL_TABLET | Freq: Every day | ORAL | 1 refills | Status: AC

## 2021-02-15 NOTE — Discharge Summary (Signed)
Physician Discharge Summary  ARLETTE SCHAAD XLK:[id number] DOB: 05-15-1991 DOA: 02/12/2021  PCP: Pcp, No  Admit date: 02/12/2021   Discharge date: 02/15/2021  Admitted From: Home.  Disposition:  Home.  Recommendations for Outpatient Follow-up:  1. Follow up with PCP in 1-2 weeks. 2. Please obtain BMP/CBC in one week. 3. Advised to follow-up with alcohol Anonymous, resources were provided. 4. Advised to follow-up with behavioral services.  Home Health: None. Equipment/Devices: None  Discharge Condition: Stable CODE STATUS:Full code Diet recommendation: Heart Healthy   Brief [facility] Course: This 30 years old female with PMH significant for alcohol abuse presents in the ED after being found unresponsive at home. Husband reports he found her lyingon the floor with eyes open but she could not talk or move after he came back home. She had a loss of bladder function and was incontinent. He did not notice any jerking or shaking of her extremities. She does not have any history of seizure disorder. Patient statesshe thought she tripped and fell on the ground but does not remember the event. She reports feeling very weak and not sure how long she laid on the ground.  Her blood sugar was found to be 29 by EMS and was given dextrose and blood sugar. Patient denies any history of diabetes or hypoglycemia. In the ED her lactic acid was found to be 5.7,CK 500,sodium 128,magnesium 1.3,glucose 299,WBC 33,000,no source of infection found,urine analysis negative, CT head was negative,chest x-ray unremarkable.  Patient was admitted for altered mental status, no witnessed seizures. She does have a history of alcohol abuse and the last drink was in the evening before this happened.  She was continued on IV fluids.  There were some electrolyte derangements which was corrected.  MRI:No acute abnormality noted. EEG : No evidence of seizures. Leukocytosis could be reactive,improved. Patient  placed on CIWA protocol for alcohol abuse. Patient feels better denies any further complaints.  Patient wants to be discharged.  Patient was provided resources to follow-up with behavioral health about alcohol abuse.  She was managed for below problems.  Discharge Diagnoses:  Principal Problem:   Hypoglycemia Active Problems:   Altered mental status   Alcohol abuse   Hypomagnesemia   Hyponatremia   Elevated liver enzymes   Low body weight due to inadequate caloric intake   Prolonged QT interval  Hypoglycemia : Improved. She was found unresponsive on the floor. She is found to have blood sugar of 29 by EMS, she was given dextrose and blood sugar has improved. She denies any history of diabetes or hypoglycemia and is not on any antihyperglycemic medications.   She might have a seizure that was unwitnessed that would have led to hypoglycemia. Blood glucose remains normal throughout hospitalization course   Altered mental status > Resolved Patient was found at home by her husband initially she was unresponsive and could not move or speak and was on the floor.  She had lost control of her bladder and had incontinence. No witnessed seizures. Patient is unsure of what happened. She does have a history of alcohol abuse and last drink the previous evening. She does have history of withdrawals in the past MRI no acute abnormality.  EEG: no evidence of seizures.  Hypomagnesemia Replaced and improved.  Hyponatremia Most likely secondary to alcohol abuse. IV fluid hydration Improved.  Elevated liver enzymes. Liver enzymes are elevated. Recheck labs tomorrow. Abdominal ultrasound showed fatty steatosis.  Leukocytosis > Resolved. Elevated WBC with no signs of infection.  Most  likely secondary to a seizure. Repeat CBC in am.  Prolonged QT interval Avoid medications which could further prolong QT interval. Monitor on telemetry.  Alcohol abuse Placed on CIWA protocol.  Ativan will be provided as needed for elevated CIWA scores and signs of withdrawal.  Consult social work for outpatient alcohol programs  Low body weight due to inadequate caloric intake Nutrition services for evaluation in the morning    Discharge Instructions  Discharge Instructions    Call MD for:  difficulty breathing, headache or visual disturbances   Complete by: As directed    Call MD for:  persistant dizziness or light-headedness   Complete by: As directed    Call MD for:  persistant nausea and vomiting   Complete by: As directed    Diet - low sodium heart healthy   Complete by: As directed    Diet Carb Modified   Complete by: As directed    Discharge instructions   Complete by: As directed    Advised to follow-up with primary care physician in 1 week.   Advised to refrain from using alcohol and other substances.   Increase activity slowly   Complete by: As directed      Allergies as of 02/15/2021   No Known Allergies     Medication List    TAKE these medications   CLEAR EYES ALL SEASONS OP Place 1 drop into both eyes daily as needed (irritation).   fluticasone 50 MCG/ACT nasal spray Commonly known as: FLONASE Place 1 spray into both nostrils daily as needed for allergies or rhinitis.   folic acid 1 MG tablet Commonly known as: FOLVITE Take 1 tablet (1 mg total) by mouth daily. Start taking on: Feb 16, 2021   ibuprofen 200 MG tablet Commonly known as: ADVIL Take 600 mg by mouth every 6 (six) hours as needed for headache. What changed: Another medication with the same name was removed. Continue taking this medication, and follow the directions you see here.   thiamine 100 MG tablet Take 1 tablet (100 mg total) by mouth daily. Start taking on: Feb 16, 2021       Follow-up Information    [facility] Follow [facility].   Contact information:  [address] [place], [place] [address] Phone: [phone]       BEHAVIORAL HEALTH INTENSIVE  CHEMICAL DEPENDENCY Follow up.   Specialty: Behavioral Health Why: This is the [facility] intensive outpatient program Contact information: [address] [place] [address] [id number] mc [place] [address] [phone]       [facility] Follow up.   Specialty: Behavioral Health Why: All of these places will work with your insurance, though you will likely have a co-pay Contact information: [address] [place] [address] [phone]              No Known Allergies  Consultations:  None   Procedures/Studies: DG Chest 2 View  Result Date: 02/12/2021 CLINICAL DATA:  Weakness, fell, low back pain EXAM: CHEST - 2 VIEW COMPARISON:  09/01/2017 FINDINGS: The heart size and mediastinal contours are within normal limits. Both lungs are clear. The visualized skeletal structures are unremarkable. IMPRESSION: No active cardiopulmonary disease. Electronically Signed   By: Sharlet Salina M.D.   On: 02/12/2021 22:44   DG Lumbar Spine Complete  Result Date: 02/12/2021 CLINICAL DATA:  Larey Seat, back pain and bruising EXAM: LUMBAR SPINE - COMPLETE 4+ VIEW COMPARISON:  None. FINDINGS: Frontal, bilateral oblique, and lateral views  of the lumbar spine are obtained. There are 6 non-rib-bearing lumbar type vertebral bodies in normal alignment. No fractures. Disc spaces are well preserved. Sacroiliac joints are normal. IMPRESSION: 1. 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 2. No acute bony abnormality. Electronically Signed   By: Sharlet Salina M.D.   On: 02/12/2021 22:45   CT Head Wo Contrast  Result Date: 02/12/2021 CLINICAL DATA:  Headache and syncopal episode. EXAM: CT HEAD WITHOUT CONTRAST TECHNIQUE: Contiguous axial images were obtained from the base of the skull through the vertex without intravenous contrast. COMPARISON:  None. FINDINGS: Brain: No evidence of acute infarction, hemorrhage, hydrocephalus, extra-axial collection or mass  lesion/mass effect. Vascular: No hyperdense vessel or unexpected calcification. Skull: Normal. Negative for fracture or focal lesion. Sinuses/Orbits: No acute finding. Other: None. IMPRESSION: No acute intracranial abnormality. Electronically Signed   By: Aram Candela M.D.   On: 02/12/2021 21:15   MR BRAIN WO CONTRAST  Result Date: 02/13/2021 CLINICAL DATA:  Seizure, alcohol/drug related. EXAM: MRI HEAD WITHOUT CONTRAST TECHNIQUE: Multiplanar, multiecho pulse sequences of the brain and surrounding structures were obtained without intravenous contrast. COMPARISON:  CT head Feb 12, 2021. FINDINGS: Brain: No acute infarction, hemorrhage, hydrocephalus, extra-axial collection or mass lesion. The hippocampi appear within normal limits and symmetric in size/signal. Vascular: Major arterial flow voids are maintained at the skull base. Skull and upper cervical spine: Normal marrow signal. Sinuses/Orbits: Clear sinuses.  Unremarkable orbits. Other: No mastoid effusions. IMPRESSION: No evidence of acute intracranial abnormality. Electronically Signed   By: Feliberto Harts MD   On: 02/13/2021 11:04   EEG adult  Result Date: 02/14/2021 Charlsie Quest, MD     02/14/2021  1:33 PM Patient Name: CRYSTALE GIANNATTASIO MRN: [id number] Epilepsy Attending: Charlsie Quest Referring Physician/Provider: Dr Trey Paula Date: 02/14/2021 Duration: 23.07 mins Patient history: 30 year old female with altered mental status.  EEG to evaluate for seizures. Level of alertness: Awake, asleep AEDs during EEG study: Ativan Technical aspects: This EEG study was done with scalp electrodes positioned according to the 10-20 International system of electrode placement. Electrical activity was acquired at a sampling rate of 500Hz  and reviewed with a high frequency filter of 70Hz  and a low frequency filter of 1Hz . EEG data were recorded continuously and digitally stored. Description: During awake state, no clear posterior dominant rhythm was seen.   Sleep was characterized by vertex waves, sleep spindles (12 to 14 Hz), maximal frontocentral region.  There is an excessive amount of 15 to 18 Hz beta activity distributed symmetrically and diffusely. Physiologic photic driving was seen during photic stimulation. Hyperventilation was not performed.   ABNORMALITY - Excessive beta, generalized IMPRESSION: This study is within normal limits. The excessive beta activity seen in the background is most likely due to the effect of benzodiazepine and is a benign EEG pattern. No seizures or epileptiform discharges were seen throughout the recording. Priyanka   Abdomen Limited RUQ (LIVER/GB)  Result Date: 02/13/2021 CLINICAL DATA:  Elevated liver enzymes EXAM: ULTRASOUND ABDOMEN LIMITED RIGHT UPPER QUADRANT COMPARISON:  None. FINDINGS: Gallbladder: No gallstones or wall thickening visualized. No sonographic Murphy sign noted by sonographer. Common bile duct: Diameter: 3 mm. Liver: Echogenic liver with Peri fissural sparing. No focal masslike finding portal vein is patent on color Doppler imaging with normal direction of blood flow towards the liver. Other: On the first few images hypoechoic lobulated appearance left of the liver is likely volume averaging in the chest. Subsequent images show  no retroperitoneal adenopathy/mass. IMPRESSION: Hepatic steatosis. Electronically Signed   By: Marnee Spring M.D.   On: 02/13/2021 05:50    CT head, MRI, EEG.   Subjective: Patient was seen and examined at bedside.  Overnight events noted.   Patient reports feeling much improved and wants to be discharged.   Patient states she will never use alcohol again.  Discharge Exam: Vitals:   02/14/21 2200 02/15/21 0417  BP: 121/77 (!) 130/95  Pulse: 79 63  Resp:  16  Temp:  98.1 F (36.7 C)  SpO2:  100%   Vitals:   02/14/21 1320 02/14/21 2052 02/14/21 2200 02/15/21 0417  BP: 122/85 121/77 121/77 (!) 130/95  Pulse: 76 79 79 63  Resp: Temp: 98.2  F (36.8 C) 98.2 F (36.8 C)  98.1 F (36.7 C)  TempSrc: Oral Oral  Oral  SpO2: 100% 100%  100%  Weight:        General: Pt is alert, awake, not in acute distress Cardiovascular: RRR, S1/S2 +, no rubs, no gallops Respiratory: CTA bilaterally, no wheezing, no rhonchi Abdominal: Soft, NT, ND, bowel sounds + Extremities: no edema, no cyanosis    The results of significant diagnostics from this hospitalization (including imaging, microbiology, ancillary and laboratory) are listed below for reference.     Microbiology: Recent Results (from the past 240 hour(s))  Culture, blood (routine x 2)     Status: None (Preliminary result)   Collection Time: 02/12/21 10:00 PM   Specimen: BLOOD  Result Value Ref Range Status   Specimen Description   Final    BLOOD LEFT ANTECUBITAL Performed at [facility], [address], [place], [place] [address]    Special Requests   Final    BOTTLES DRAWN AEROBIC AND ANAEROBIC Blood Culture adequate volume Performed at [facility], [address] [place], [place] [address]    Culture   Final    NO GROWTH 1 DAY Performed at [facility], [address] [place], [place] [address]    Report Status PENDING  Incomplete  SARS CORONAVIRUS 2 (TAT 6-24 HRS) Nasopharyngeal Nasopharyngeal Swab     Status: None   Collection Time: 02/12/21 10:00 PM   Specimen: Nasopharyngeal Swab  Result Value Ref Range Status   SARS Coronavirus 2 NEGATIVE NEGATIVE Final    Comment: (NOTE) SARS-CoV-2 target nucleic acids are NOT DETECTED.  The SARS-CoV-2 RNA is generally detectable in upper and lower respiratory specimens during the acute phase of infection. Negative results do not preclude SARS-CoV-2 infection, do not rule out co-infections with other pathogens, and should not be used as the sole basis for treatment or other patient management decisions. Negative results must be combined with clinical observations, patient  history, and epidemiological information. The expected result is Negative.  Fact Sheet for Patients: HairSlick.no  Fact Sheet for Healthcare Providers: quierodirigir.com  This test is not yet approved or cleared by the [place] FDA and  has been authorized for detection and/or diagnosis of SARS-CoV-2 by FDA under an Emergency Use Authorization (EUA). This EUA will remain  in effect (meaning this test can be used) for the duration of the COVID-19 declaration under Se ction 564(b)(1) of the Act, 21 U.S.C. section 360bbb-3(b)(1), unless the authorization is terminated or revoked sooner.  Performed at [facility], [address], [place], [place] [address]   Culture, blood (routine x 2)     Status: None (Preliminary result)  Collection Time: 02/13/21  9:27 AM   Specimen: BLOOD LEFT HAND  Result Value Ref Range Status   Specimen Description   Final    BLOOD LEFT HAND Performed at [facility], [address] [place], [place] [address]    Special Requests   Final    BOTTLES DRAWN AEROBIC AND ANAEROBIC Blood Culture adequate volume Performed at [facility], [address], [place], [place] [address]    Culture   Final    NO GROWTH < 24 HOURS Performed at [facility], [address] [place], [place] [address]    Report Status PENDING  Incomplete     Labs: BNP (last 3 results) No results for input(s): BNP in the last 8760 hours. Basic Metabolic Panel: Recent Labs  Lab 02/12/21 1821 02/12/21 2200 02/13/21 0430 02/14/21 0417 02/15/21 0542  NA 128*  --  143 138 139  K 5.1  --  4.5 3.6 3.6  CL 99  --  117* 107 106  CO2 10*  --  17* 22 25  GLUCOSE 299*  --  103* 88 92  BUN 10  --  8 <5* <5*  CREATININE 0.94  --  0.39* 0.60 0.56  CALCIUM 8.4*  --  7.5* 8.6* 9.0  MG  --  1.3* 2.0 1.9 1.7  PHOS  --   --  1.4* 2.0* 3.3   Liver Function Tests: Recent Labs  Lab  02/12/21 1821 02/13/21 0430 02/15/21 0542  AST 454* 188* 89*  ALT 129* 81* 58*  ALKPHOS 76 45 55  BILITOT 1.3* 0.7 0.3  PROT 9.1* 6.5 6.7  ALBUMIN 4.9 3.5 3.6   No results for input(s): LIPASE, AMYLASE in the last 168 hours. No results for input(s): AMMONIA in the last 168 hours. CBC: Recent Labs  Lab 02/12/21 1821 02/13/21 0430 02/14/21 0417 02/15/21 0542  WBC 33.3* 24.2* 14.1* 7.3  NEUTROABS 31.3*  --   --   --   HGB 12.9 12.0 11.5* 11.6*  HCT 43.2 37.4 35.3* 35.7*  MCV 86.9 82.9 81.0 81.9  PLT 231 176 149* 132*   Cardiac Enzymes: Recent Labs  Lab 02/12/21 2200  CKTOTAL 500*   BNP: Invalid input(s): POCBNP CBG: Recent Labs  Lab 02/13/21 0610 02/13/21 0818 02/13/21 1310 02/13/21 1703 02/14/21 0731  GLUCAP 91 81 155* 123* 92   D-Dimer No results for input(s): DDIMER in the last 72 hours. Hgb A1c No results for input(s): HGBA1C in the last 72 hours. Lipid Profile No results for input(s): CHOL, HDL, LDLCALC, TRIG, CHOLHDL, LDLDIRECT in the last 72 hours. Thyroid function studies No results for input(s): TSH, T4TOTAL, T3FREE, THYROIDAB in the last 72 hours.  Invalid input(s): FREET3 Anemia work up Recent Labs    02/12/21 2200  VITAMINB12 405   Urinalysis    Component Value Date/Time   COLORURINE YELLOW 02/12/2021 1945   APPEARANCEUR CLEAR 02/12/2021 1945   LABSPEC 1.009 02/12/2021 1945   PHURINE 5.0 02/12/2021 1945   GLUCOSEU >=500 (A) 02/12/2021 1945   HGBUR LARGE (A) 02/12/2021 1945   BILIRUBINUR NEGATIVE 02/12/2021 1945   KETONESUR 80 (A) 02/12/2021 1945   PROTEINUR 30 (A) 02/12/2021 1945   UROBILINOGEN 0.2 11/26/2015 1548   NITRITE NEGATIVE 02/12/2021 1945   LEUKOCYTESUR NEGATIVE 02/12/2021 1945   Sepsis Labs Invalid input(s): PROCALCITONIN,  WBC,  LACTICIDVEN Microbiology Recent Results (from the past 240 hour(s))  Culture, blood (routine x 2)     Status: None (Preliminary result)  Collection Time: 02/12/21 10:00 PM   Specimen:  BLOOD  Result Value Ref Range Status   Specimen Description   Final    BLOOD LEFT ANTECUBITAL Performed at [facility], [address] [place], [place] [address]    Special Requests   Final    BOTTLES DRAWN AEROBIC AND ANAEROBIC Blood Culture adequate volume Performed at [facility], [address] [place], [place] [address]    Culture   Final    NO GROWTH 1 DAY Performed at [facility], [address] [place], [place] [address]    Report Status PENDING  Incomplete  SARS CORONAVIRUS 2 (TAT 6-24 HRS) Nasopharyngeal Nasopharyngeal Swab     Status: None   Collection Time: 02/12/21 10:00 PM   Specimen: Nasopharyngeal Swab  Result Value Ref Range Status   SARS Coronavirus 2 NEGATIVE NEGATIVE Final    Comment: (NOTE) SARS-CoV-2 target nucleic acids are NOT DETECTED.  The SARS-CoV-2 RNA is generally detectable in upper and lower respiratory specimens during the acute phase of infection. Negative results do not preclude SARS-CoV-2 infection, do not rule out co-infections with other pathogens, and should not be used as the sole basis for treatment or other patient management decisions. Negative results must be combined with clinical observations, patient history, and epidemiological information. The expected result is Negative.  Fact Sheet for Patients: HairSlick.nohttps://www.fda.gov/media/138098/download  Fact Sheet for Healthcare Providers: quierodirigir.comhttps://www.fda.gov/media/138095/download  This test is not yet approved or cleared by the [place] FDA and  has been authorized for detection and/or diagnosis of SARS-CoV-2 by FDA under an Emergency Use Authorization (EUA). This EUA will remain  in effect (meaning this test can be used) for the duration of the COVID-19 declaration under Se ction 564(b)(1) of the Act, 21 U.S.C. section 360bbb-3(b)(1), unless the authorization is terminated or revoked sooner.  Performed at [facility],  [address] [place], [place] [address]   Culture, blood (routine x 2)     Status: None (Preliminary result)   Collection Time: 02/13/21  9:27 AM   Specimen: BLOOD LEFT HAND  Result Value Ref Range Status   Specimen Description   Final    BLOOD LEFT HAND Performed at [facility], [address] [place], [place] [address]    Special Requests   Final    BOTTLES DRAWN AEROBIC AND ANAEROBIC Blood Culture adequate volume Performed at [facility], [address] [place], [place] [address]    Culture   Final    NO GROWTH < 24 HOURS Performed at [facility], [address] [place], [place] [address]    Report Status PENDING  Incomplete     Time coordinating discharge: Over 30 minutes  SIGNED:   Cipriano BunkerPARDEEP Shreeya Recendiz, MD  [facility] 02/15/2021, 11:00 AM Pager   If 7PM-7AM, please contact night-coverage [email or web address]

## 2021-02-15 NOTE — Discharge Instructions (Signed)
Advised to follow-up with primary care physician in 1 week. Advised to refrain from using alcohol and other substances. Substance resources were provided.

## 2021-02-15 NOTE — Progress Notes (Signed)
Pt leaving this afternoon with her significant other. Alert and oriented. Pt without c/o. Discharge instructions given/explained with pt verbalizing understanding.  Pt aware to followup with PCP.

## 2021-02-18 LAB — CULTURE, BLOOD (ROUTINE X 2)
Culture: NO GROWTH
Culture: NO GROWTH
Special Requests: ADEQUATE
Special Requests: ADEQUATE

## 2022-06-25 IMAGING — CR DG LUMBAR SPINE COMPLETE 4+V
5 series · 5 of 5 positions shown · non-contrast
Comparison: None.

CLINICAL DATA: Fell, back pain and bruising

EXAM:
LUMBAR SPINE - COMPLETE 4+ VIEW

[t lumbar spine ap]
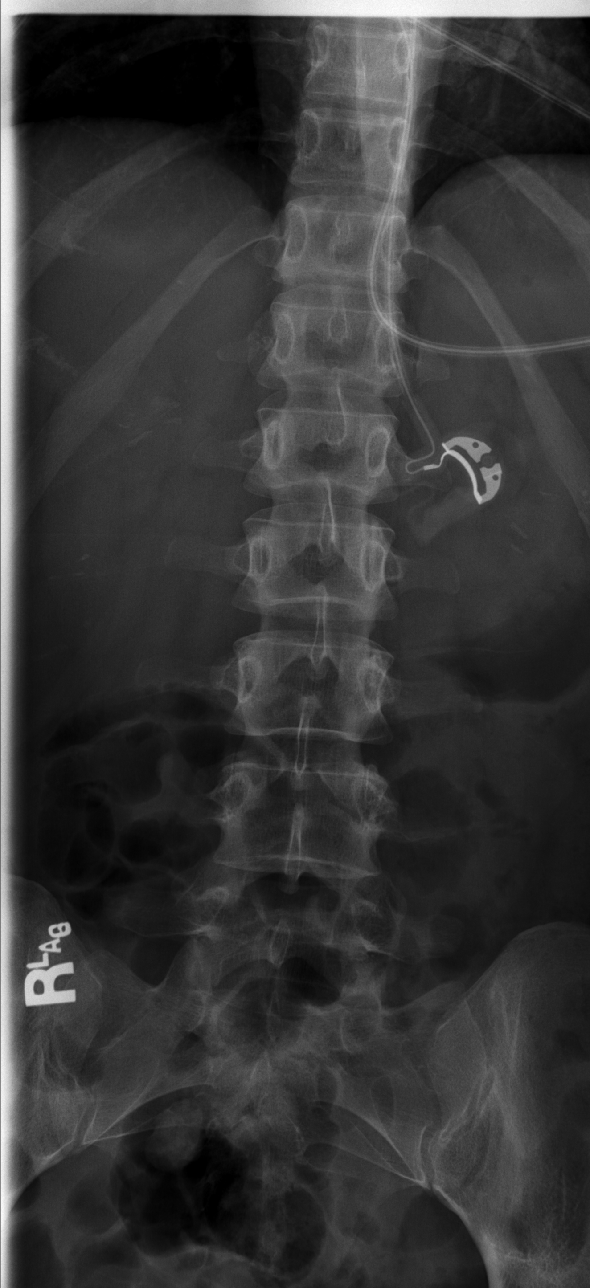

[t lumbar spine obl (1 of 2)]
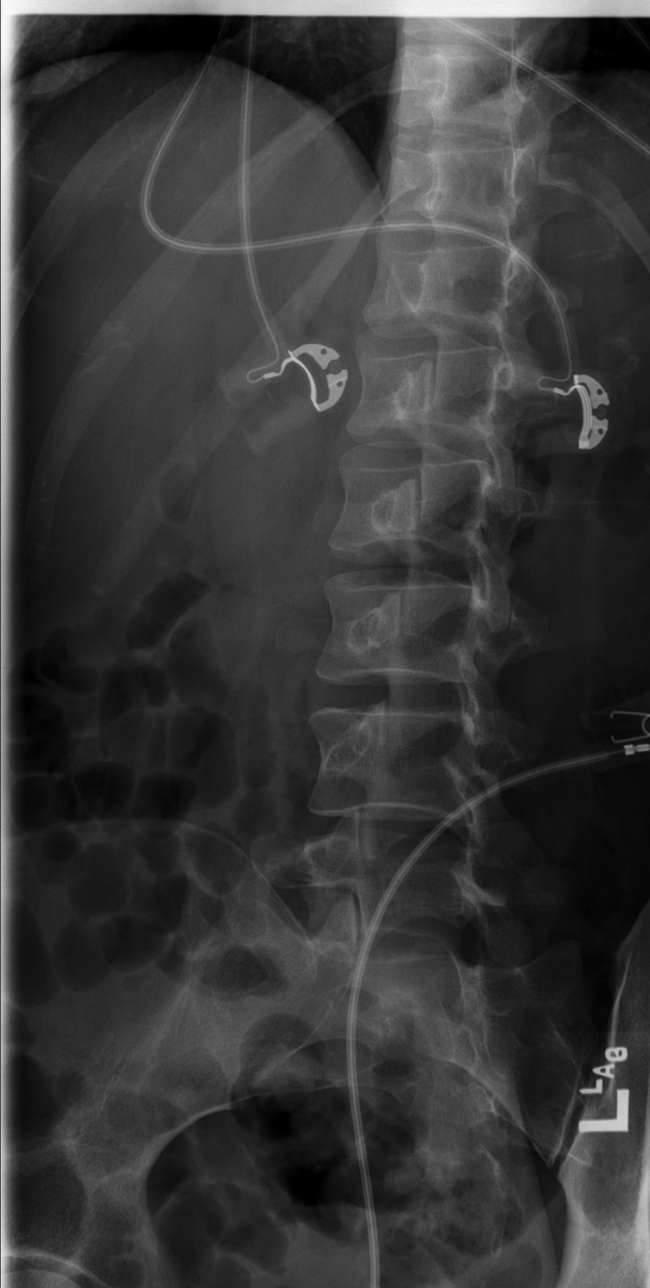

[t lumbar spine obl (2 of 2)]
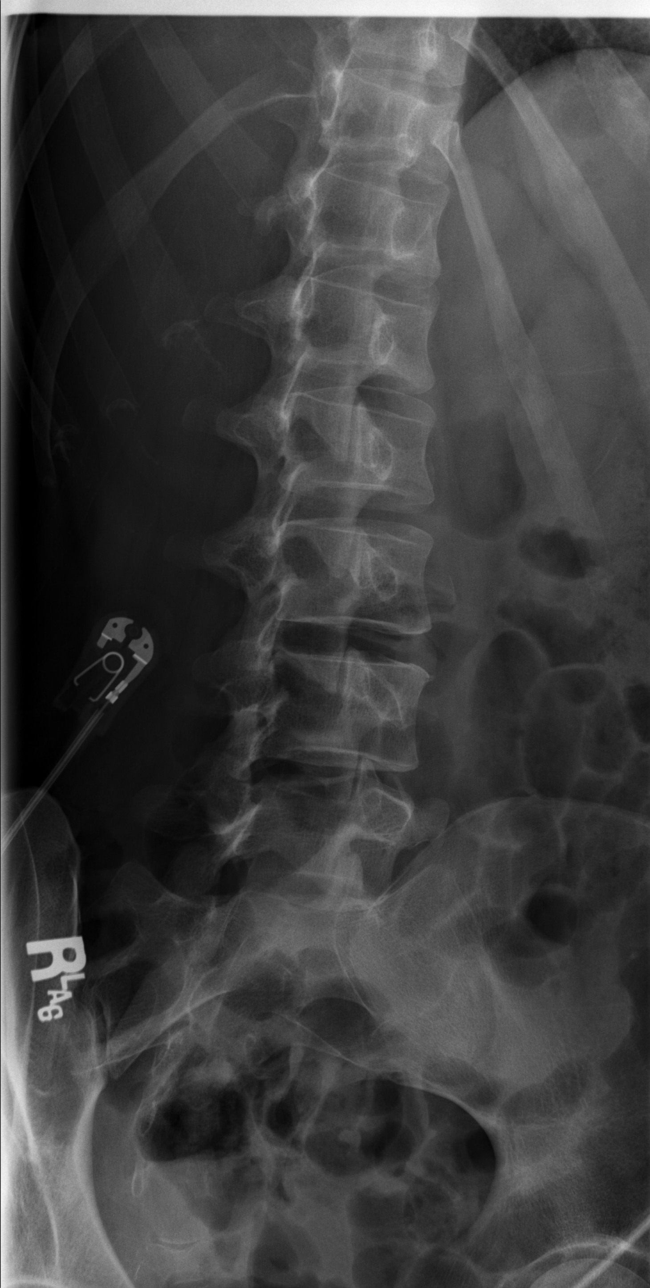

[t lumbar spine lat]
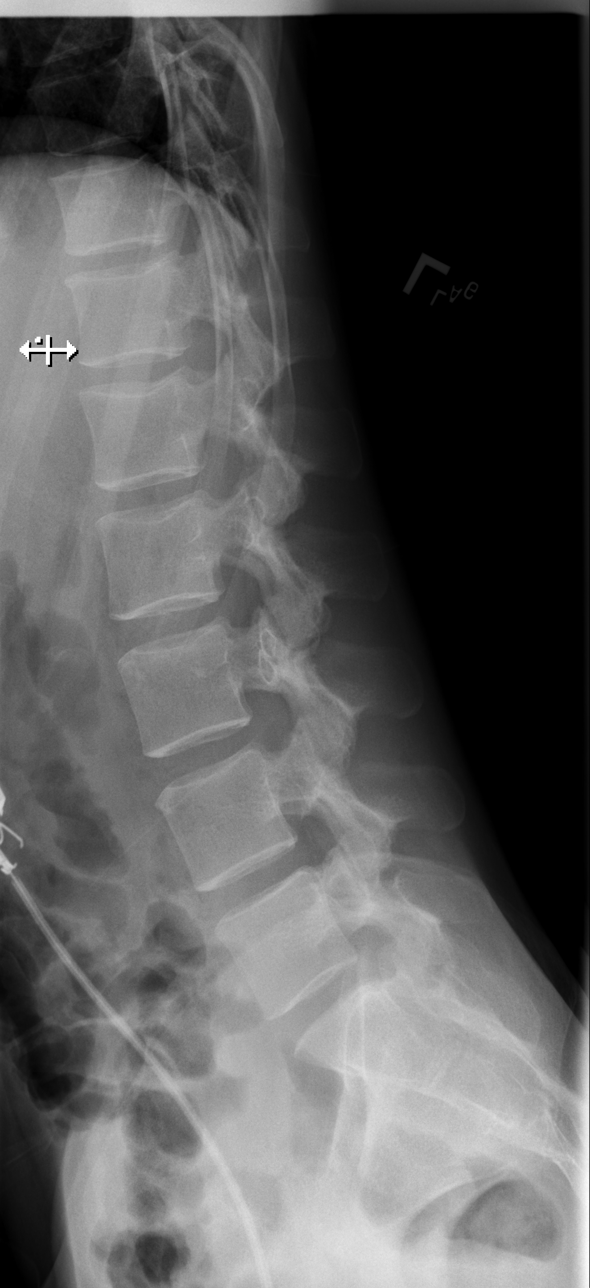

[t lumbar l-5 s-1 spot]
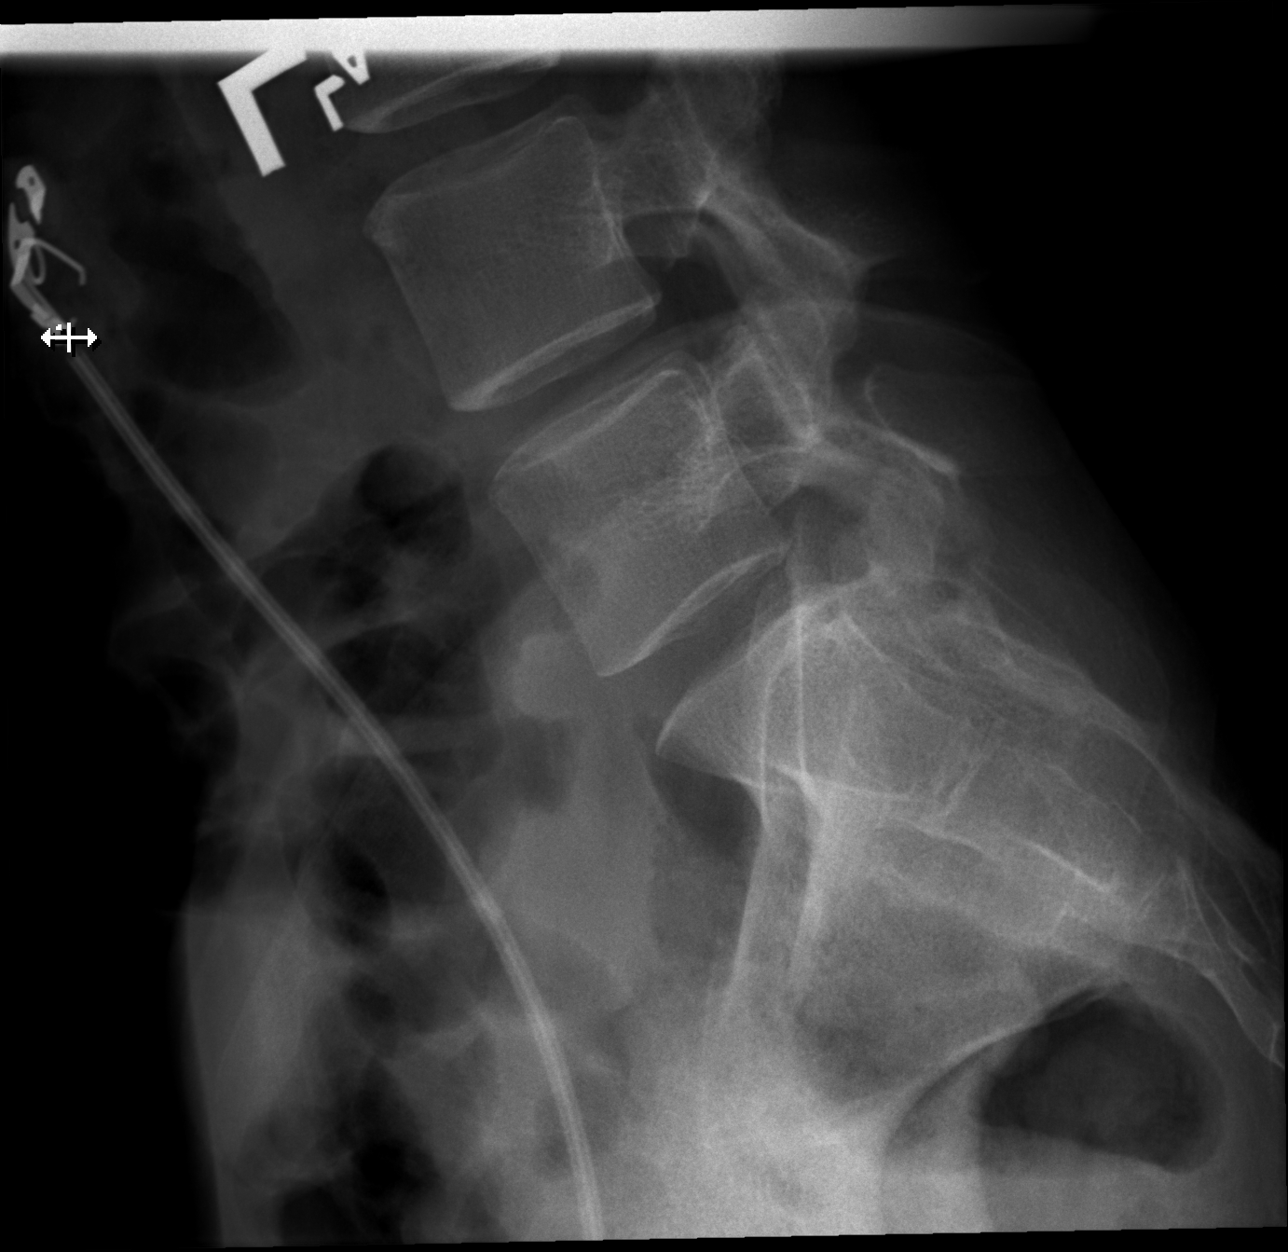

[5 of 5 positions shown; findings below may reference images not displayed]

FINDINGS: Frontal, bilateral oblique, and lateral views of the lumbar spine
are obtained. There are 6 non-rib-bearing lumbar type vertebral
bodies in normal alignment. No fractures. Disc spaces are well
preserved. Sacroiliac joints are normal.
IMPRESSION: 1. Lumbar segmentation anomaly with 6 non-rib-bearing lumbar type
vertebral bodies.
2. No acute bony abnormality.

## 2022-06-25 IMAGING — CR DG CHEST 2V
2 series · 2 of 2 positions shown · non-contrast
Comparison: 09/01/2017

CLINICAL DATA: Weakness, fell, low back pain

EXAM:
CHEST - 2 VIEW

[w chest lat]
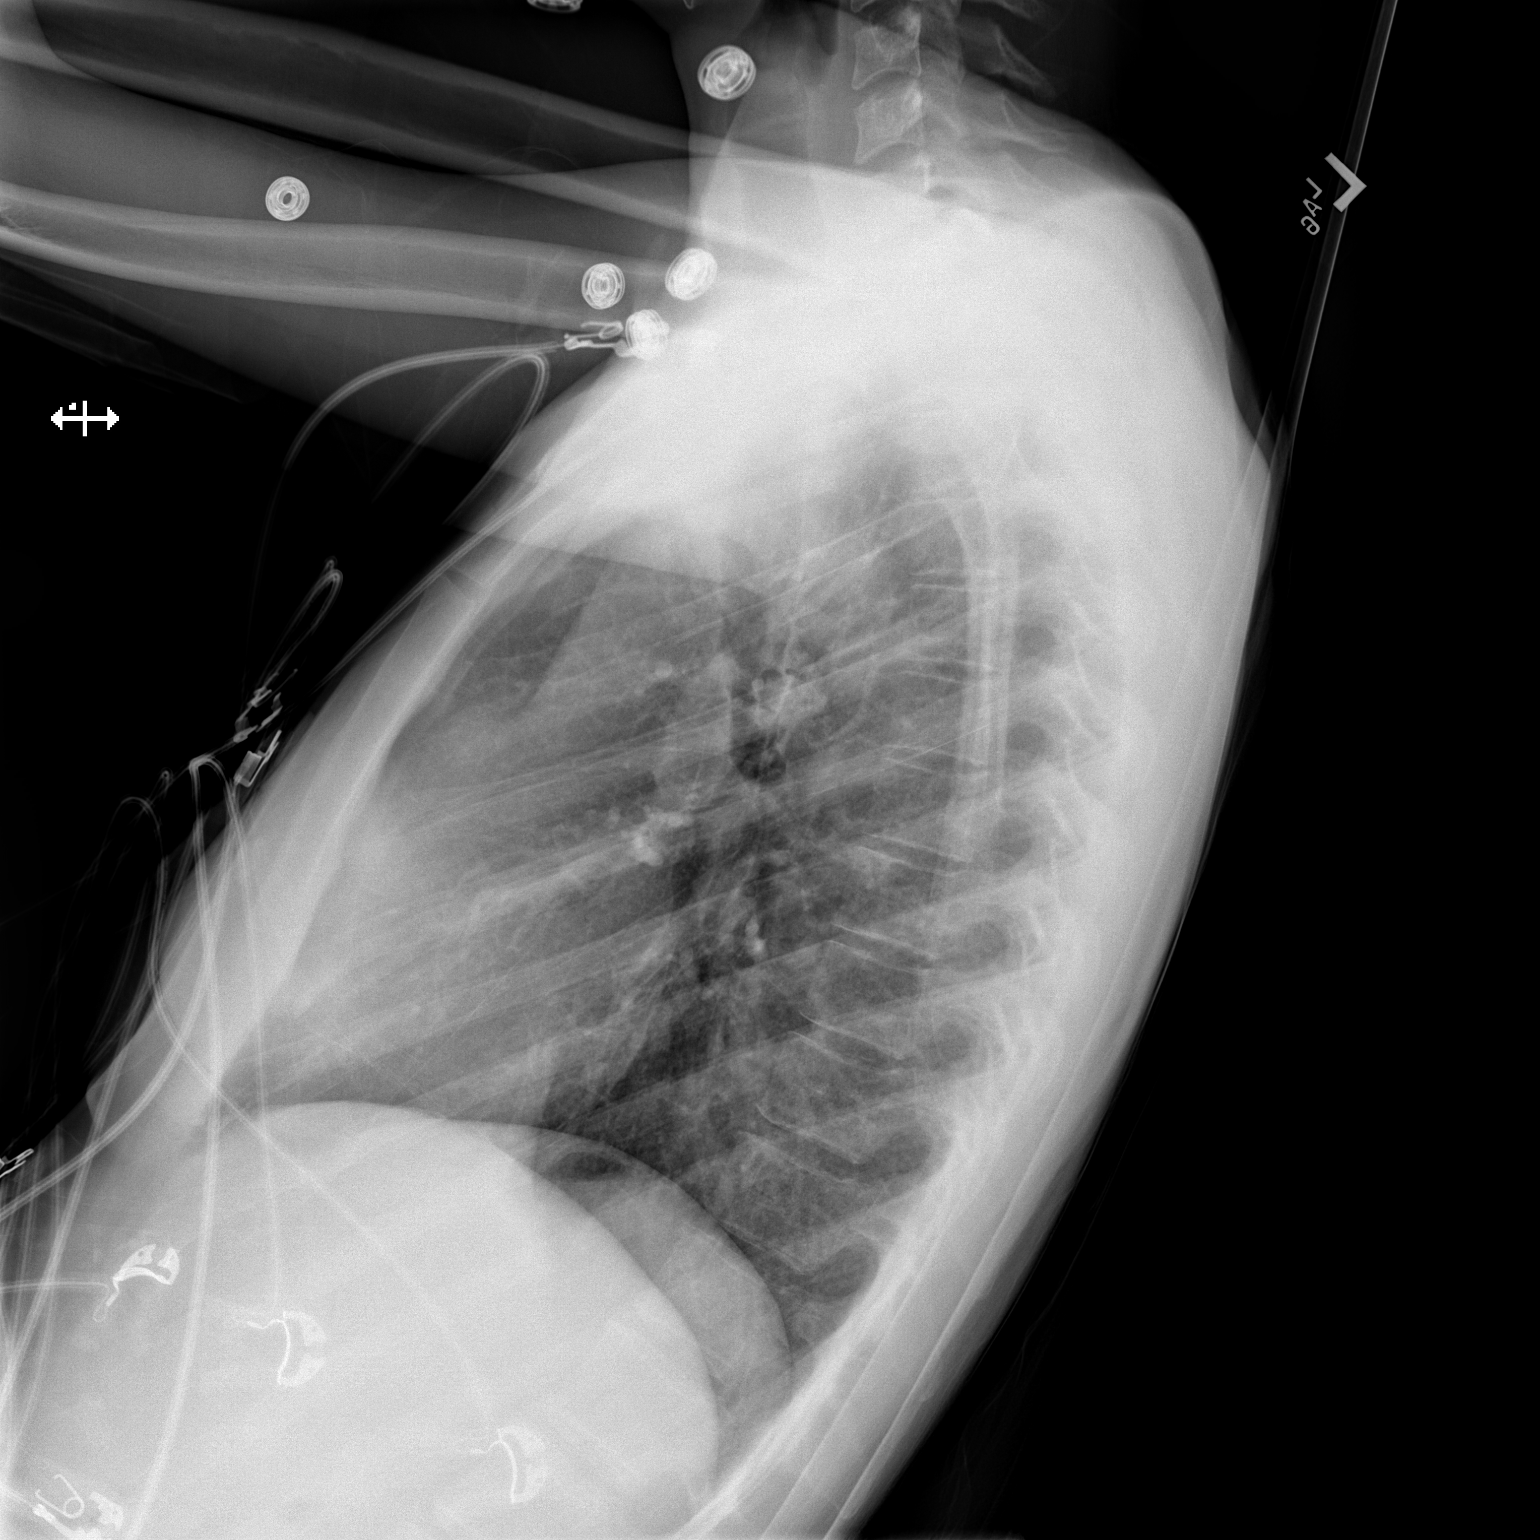

[x chest ap]
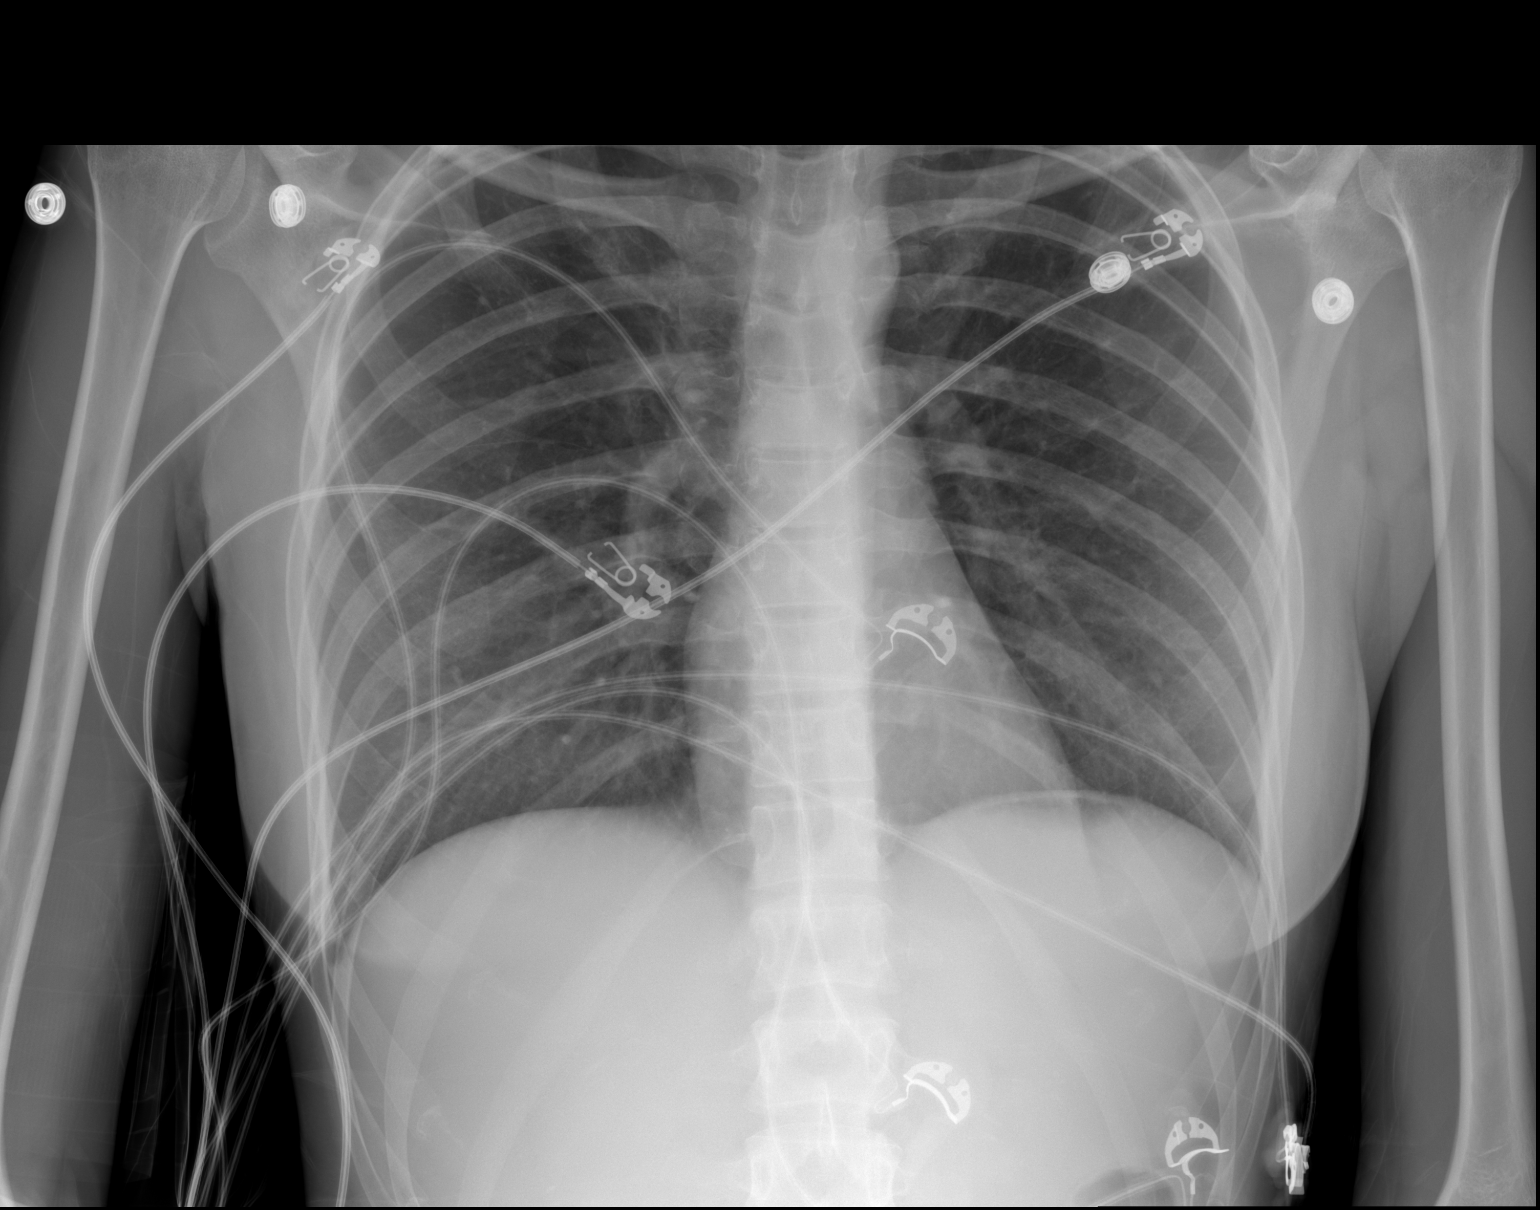

[2 of 2 positions shown; findings below may reference images not displayed]

FINDINGS: The heart size and mediastinal contours are within normal limits.
Both lungs are clear. The visualized skeletal structures are
unremarkable.
IMPRESSION: No active cardiopulmonary disease.

## 2022-10-14 ENCOUNTER — Telehealth: Payer: Self-pay

## 2022-10-14 NOTE — Telephone Encounter (Signed)
Sending mychart msg. AS, CMA
